# Patient Record
Sex: Female | Born: 1970 | Race: Black or African American | Hispanic: No | Marital: Married
Health system: Midwestern US, Community
[De-identification: ages and names within clinical notes are randomized; demographics above are authoritative.]

## PROBLEM LIST (undated history)

## (undated) ENCOUNTER — Inpatient Hospital Stay (HOSPITAL_COMMUNITY): Payer: Self-pay

## (undated) DIAGNOSIS — Z789 Other specified health status: Secondary | ICD-10-CM

## (undated) DIAGNOSIS — M2391 Unspecified internal derangement of right knee: Secondary | ICD-10-CM

## (undated) DIAGNOSIS — Q396 Congenital diverticulum of esophagus: Secondary | ICD-10-CM

---

## 1992-09-17 HISTORY — PX: SKIN GRAFT: SHX250

## 1998-09-17 HISTORY — PX: BREAST REDUCTION SURGERY: SHX8

## 2016-03-30 LAB — OB RESULTS CONSOLE ABO/RH: RH TYPE: POSITIVE

## 2016-03-30 LAB — OB RESULTS CONSOLE HEPATITIS B SURFACE ANTIGEN: HEP B S AG: NEGATIVE

## 2016-03-30 LAB — OB RESULTS CONSOLE RPR: RPR: NONREACTIVE

## 2016-03-30 LAB — OB RESULTS CONSOLE ANTIBODY SCREEN: ANTIBODY SCREEN: NEGATIVE

## 2016-03-30 LAB — OB RESULTS CONSOLE RUBELLA ANTIBODY, IGM: RUBELLA: IMMUNE

## 2016-03-30 LAB — OB RESULTS CONSOLE HIV ANTIBODY (ROUTINE TESTING): HIV: NONREACTIVE

## 2016-04-03 LAB — OB RESULTS CONSOLE GC/CHLAMYDIA
Chlamydia: NEGATIVE
Gonorrhea: NEGATIVE

## 2016-05-10 LAB — OB RESULTS CONSOLE GBS: GBS: POSITIVE

## 2016-09-03 ENCOUNTER — Ambulatory Visit (HOSPITAL_COMMUNITY): Payer: 59

## 2016-09-03 ENCOUNTER — Other Ambulatory Visit (HOSPITAL_COMMUNITY): Payer: Self-pay | Admitting: Obstetrics and Gynecology

## 2016-09-03 DIAGNOSIS — R609 Edema, unspecified: Secondary | ICD-10-CM

## 2016-09-25 ENCOUNTER — Encounter (HOSPITAL_COMMUNITY): Payer: Self-pay | Admitting: *Deleted

## 2016-09-25 ENCOUNTER — Inpatient Hospital Stay (HOSPITAL_COMMUNITY)
Admission: AD | Admit: 2016-09-25 | Discharge: 2016-09-25 | Disposition: A | Discharge status: Home / Self Care | Payer: BC Managed Care – PPO | Source: Ambulatory Visit | Attending: Obstetrics and Gynecology | Admitting: Obstetrics and Gynecology

## 2016-09-25 DIAGNOSIS — Z3A36 36 weeks gestation of pregnancy: Secondary | ICD-10-CM | POA: Insufficient documentation

## 2016-09-25 DIAGNOSIS — O26893 Other specified pregnancy related conditions, third trimester: Secondary | ICD-10-CM | POA: Insufficient documentation

## 2016-09-25 DIAGNOSIS — M7989 Other specified soft tissue disorders: Secondary | ICD-10-CM | POA: Insufficient documentation

## 2016-09-25 DIAGNOSIS — H538 Other visual disturbances: Secondary | ICD-10-CM | POA: Insufficient documentation

## 2016-09-25 DIAGNOSIS — O133 Gestational [pregnancy-induced] hypertension without significant proteinuria, third trimester: Secondary | ICD-10-CM | POA: Diagnosis not present

## 2016-09-25 LAB — COMPREHENSIVE METABOLIC PANEL
ALK PHOS: 153 U/L — AB (ref 38–126)
ALT: 17 U/L (ref 14–54)
AST: 30 U/L (ref 15–41)
Albumin: 2.8 g/dL — ABNORMAL LOW (ref 3.5–5.0)
Anion gap: 8 (ref 5–15)
BUN: 10 mg/dL (ref 6–20)
CALCIUM: 9.6 mg/dL (ref 8.9–10.3)
CO2: 20 mmol/L — AB (ref 22–32)
CREATININE: 0.85 mg/dL (ref 0.44–1.00)
Chloride: 109 mmol/L (ref 101–111)
GFR calc non Af Amer: >60 mL/min (ref >60)
GLUCOSE: 86 mg/dL (ref 65–99)
Potassium: 3.9 mmol/L (ref 3.5–5.1)
SODIUM: 137 mmol/L (ref 135–145)
Total Bilirubin: 1.1 mg/dL (ref 0.3–1.2)
Total Protein: 6.4 g/dL — ABNORMAL LOW (ref 6.5–8.1)

## 2016-09-25 LAB — CBC
HCT: 40.4 % (ref 36.0–46.0)
HEMOGLOBIN: 14.9 g/dL (ref 12.0–15.0)
MCH: 32 pg (ref 26.0–34.0)
MCHC: 36.9 g/dL — ABNORMAL HIGH (ref 30.0–36.0)
MCV: 86.9 fL (ref 78.0–100.0)
Platelets: 127 10*3/uL — ABNORMAL LOW (ref 150–400)
RBC: 4.65 MIL/uL (ref 3.87–5.11)
RDW: 14.8 % (ref 11.5–15.5)
WBC: 8.4 10*3/uL (ref 4.0–10.5)

## 2016-09-25 LAB — URIC ACID: Uric Acid, Serum: 6.8 mg/dL — ABNORMAL HIGH (ref 2.3–6.6)

## 2016-09-25 LAB — PROTEIN / CREATININE RATIO, URINE
CREATININE, URINE: 28 mg/dL
Total Protein, Urine: <6 mg/dL

## 2016-09-25 NOTE — MAU Note (Signed)
Provider at bedside

## 2016-09-25 NOTE — MAU Note (Signed)
Notified provider that patient's labs were (Provider reviewed labs). Provider said to discharge patient.

## 2016-09-25 NOTE — MAU Note (Signed)
PT [facility] PT has blurred vision with a slight headache. Denies any vaginal bleeding or LOF

## 2016-09-25 NOTE — Discharge Instructions (Signed)
Hypertension During Pregnancy Hypertension is also called high blood pressure. High blood pressure means that the force of your blood moving in your body is too strong. When you are pregnant, this condition should be watched carefully. It can cause problems for you and your baby. Follow these instructions at home: Eating and drinking  Drink enough fluid to keep your pee (urine) clear or pale yellow.  Eat healthy foods that are low in salt (sodium). ? Do not add salt to your food. ? Check labels on foods and drinks to see much salt is in them. Look on the label where you see "Sodium." Lifestyle  Do not use any products that contain nicotine or tobacco, such as cigarettes and e-cigarettes. If you need help quitting, ask your doctor.  Do not use alcohol.  Avoid caffeine.  Avoid stress. Rest and get plenty of sleep. General instructions  Take over-the-counter and prescription medicines only as told by your doctor.  While lying down, lie on your left side. This keeps pressure off your baby.  While sitting or lying down, raise (elevate) your feet. Try putting some pillows under your lower legs.  Exercise regularly. Ask your doctor what kinds of exercise are best for you.  Keep all prenatal and follow-up visits as told by your doctor. This is important. Contact a doctor if:  You have symptoms that your doctor told you to watch for, such as: ? Fever. ? Throwing up (vomiting). ? Headache. Get help right away if:  You have very bad pain in your belly (abdomen).  You are throwing up, and this does not get better with treatment.  You suddenly get swelling in your hands, ankles, or face.  You gain 4 lb (1.8 kg) or more in 1 week.  You get bleeding from your vagina.  You have blood in your pee.  You do not feel your baby moving as much as normal.  You have a change in vision.  You have muscle twitching or sudden tightening (spasms).  You have trouble breathing.  Your lips  or fingernails turn blue. This information is not intended to replace advice given to you by your health care provider. Make sure you discuss any questions you have with your health care provider. Document Released: 10/06/2010 Document Revised: 05/15/2016 Document Reviewed: 05/15/2016 Elsevier Interactive Patient Education  2017 [employer]  

## 2016-09-25 NOTE — MAU Provider Note (Signed)
History     Chief Complaint  Patient presents with  . PIH  46 yo G1P0 MBF @ 36 3/[redacted] weeks gestation sent from office  [facility] to BP 132/90 with c/o h/a, blurry vision and increased leg swelling  OB History    Gravida Para Term Preterm AB Living   1             SAB TAB Ectopic Multiple Live Births                  History reviewed. No pertinent past medical history.  History reviewed. No pertinent surgical history.  History reviewed. No pertinent family history.  Social History  Substance Use Topics  . Smoking status: Not on file  . Smokeless tobacco: Not on file  . Alcohol use Not on file    Allergies: Allergies not on file  No prescriptions prior to admission.     Physical Exam   Blood pressure 123/93, pulse 106, temperature 97.8 F (36.6 C), resp. rate 18, height 5\' 4"  (1.626 m), weight 82.6 kg (182 lb), last menstrual period 01/15/2016.  No exam performed today, sent from office ( exam done there).  ED Course  IMP: gestational HTN P) PIH labs. Serial BP. monitor MDM   addendum:  NST reactive CBC    Component Value Date/Time   WBC 8.4 09/25/2016 1825   RBC 4.65 09/25/2016 1825   HGB 14.9 09/25/2016 1825   HCT 40.4 09/25/2016 1825   PLT 127 (L) 09/25/2016 1825   MCV 86.9 09/25/2016 1825   MCH 32.0 09/25/2016 1825   MCHC 36.9 (H) 09/25/2016 1825   RDW 14.8 09/25/2016 1825   CMP     Component Value Date/Time   NA 137 09/25/2016 1825   K 3.9 09/25/2016 1825   CL 109 09/25/2016 1825   CO2 20 (L) 09/25/2016 1825   GLUCOSE 86 09/25/2016 1825   BUN 10 09/25/2016 1825   CREATININE 0.85 09/25/2016 1825   CALCIUM 9.6 09/25/2016 1825   PROT 6.4 (L) 09/25/2016 1825   ALBUMIN 2.8 (L) 09/25/2016 1825   AST 30 09/25/2016 1825   ALT 17 09/25/2016 1825   ALKPHOS 153 (H) 09/25/2016 1825   BILITOT 1.1 09/25/2016 1825   GFRNONAA >60 09/25/2016 1825   GFRAA >60 09/25/2016 1825  uric acid 6.8 Protein/creatinine ratio  Not measurable P)  d/c home. Return to office on Tuesday. PIH warning signs. oow until next visit  Alfa Leibensperger A, MD 6:57 PM 09/25/2016

## 2016-10-02 ENCOUNTER — Other Ambulatory Visit: Payer: Self-pay | Admitting: Obstetrics and Gynecology

## 2016-10-10 ENCOUNTER — Inpatient Hospital Stay (HOSPITAL_COMMUNITY): Payer: BC Managed Care – PPO

## 2016-10-10 ENCOUNTER — Encounter (HOSPITAL_COMMUNITY): Payer: Self-pay

## 2016-10-10 ENCOUNTER — Inpatient Hospital Stay (HOSPITAL_COMMUNITY)
Admission: AD | Admit: 2016-10-10 | Discharge: 2016-10-15 | DRG: 766 | Disposition: A | Discharge status: Home / Self Care | Payer: BC Managed Care – PPO | Source: Ambulatory Visit | Attending: Obstetrics and Gynecology | Admitting: Obstetrics and Gynecology

## 2016-10-10 DIAGNOSIS — Z6832 Body mass index (BMI) 32.0-32.9, adult: Secondary | ICD-10-CM | POA: Diagnosis not present

## 2016-10-10 DIAGNOSIS — Z3A38 38 weeks gestation of pregnancy: Secondary | ICD-10-CM | POA: Diagnosis not present

## 2016-10-10 DIAGNOSIS — O99824 Streptococcus B carrier state complicating childbirth: Secondary | ICD-10-CM | POA: Diagnosis present

## 2016-10-10 DIAGNOSIS — O99214 Obesity complicating childbirth: Secondary | ICD-10-CM | POA: Diagnosis present

## 2016-10-10 DIAGNOSIS — R030 Elevated blood-pressure reading, without diagnosis of hypertension: Secondary | ICD-10-CM | POA: Diagnosis present

## 2016-10-10 DIAGNOSIS — O1494 Unspecified pre-eclampsia, complicating childbirth: Principal | ICD-10-CM | POA: Diagnosis present

## 2016-10-10 DIAGNOSIS — E669 Obesity, unspecified: Secondary | ICD-10-CM | POA: Diagnosis present

## 2016-10-10 DIAGNOSIS — O1493 Unspecified pre-eclampsia, third trimester: Secondary | ICD-10-CM | POA: Diagnosis present

## 2016-10-10 HISTORY — DX: Other specified health status: Z78.9

## 2016-10-10 LAB — URINALYSIS, ROUTINE W REFLEX MICROSCOPIC
Bilirubin Urine: NEGATIVE
Glucose, UA: NEGATIVE mg/dL
Hgb urine dipstick: NEGATIVE
KETONES UR: NEGATIVE mg/dL
Leukocytes, UA: NEGATIVE
NITRITE: NEGATIVE
PH: 6 (ref 5.0–8.0)
PROTEIN: NEGATIVE mg/dL
Specific Gravity, Urine: 1.004 — ABNORMAL LOW (ref 1.005–1.030)

## 2016-10-10 LAB — CBC
HCT: 40.5 % (ref 36.0–46.0)
Hemoglobin: 14.8 g/dL (ref 12.0–15.0)
MCH: 32 pg (ref 26.0–34.0)
MCHC: 36.5 g/dL — AB (ref 30.0–36.0)
MCV: 87.5 fL (ref 78.0–100.0)
PLATELETS: 137 10*3/uL — AB (ref 150–400)
RBC: 4.63 MIL/uL (ref 3.87–5.11)
RDW: 15 % (ref 11.5–15.5)
WBC: 8.5 10*3/uL (ref 4.0–10.5)

## 2016-10-10 LAB — COMPREHENSIVE METABOLIC PANEL
ALT: 16 U/L (ref 14–54)
ANION GAP: 10 (ref 5–15)
AST: 33 U/L (ref 15–41)
Albumin: 2.9 g/dL — ABNORMAL LOW (ref 3.5–5.0)
Alkaline Phosphatase: 214 U/L — ABNORMAL HIGH (ref 38–126)
BUN: 13 mg/dL (ref 6–20)
CO2: 21 mmol/L — AB (ref 22–32)
Calcium: 9.3 mg/dL (ref 8.9–10.3)
Chloride: 107 mmol/L (ref 101–111)
Creatinine, Ser: 1.03 mg/dL — ABNORMAL HIGH (ref 0.44–1.00)
GFR calc non Af Amer: >60 mL/min (ref >60)
GLUCOSE: 105 mg/dL — AB (ref 65–99)
POTASSIUM: 3.9 mmol/L (ref 3.5–5.1)
SODIUM: 138 mmol/L (ref 135–145)
TOTAL PROTEIN: 6.6 g/dL (ref 6.5–8.1)
Total Bilirubin: 0.8 mg/dL (ref 0.3–1.2)

## 2016-10-10 LAB — PROTEIN / CREATININE RATIO, URINE
CREATININE, URINE: 44 mg/dL
PROTEIN CREATININE RATIO: 0.5 mg/mg{creat} — AB (ref 0.00–0.15)
TOTAL PROTEIN, URINE: 22 mg/dL

## 2016-10-10 LAB — URIC ACID: Uric Acid, Serum: 7.3 mg/dL — ABNORMAL HIGH (ref 2.3–6.6)

## 2016-10-10 MED ORDER — OXYTOCIN 40 UNITS IN LACTATED RINGERS INFUSION - SIMPLE MED: 2.5000 [IU]/h | INTRAVENOUS | Status: DC

## 2016-10-10 MED ORDER — LACTATED RINGERS IV SOLN
INTRAVENOUS | Status: DC
  Administered 2016-10-10 – 2016-10-11 (×2): via INTRAVENOUS
  Administered 2016-10-11: 125 mL via INTRAVENOUS
  Administered 2016-10-11 – 2016-10-12 (×2): via INTRAVENOUS

## 2016-10-10 MED ORDER — MISOPROSTOL 25 MCG QUARTER TABLET
25.0000 ug | ORAL_TABLET | ORAL | Status: DC | PRN
  Administered 2016-10-10: 25 ug via VAGINAL
  Filled 2016-10-10: qty 0.25

## 2016-10-10 MED ORDER — LACTATED RINGERS IV SOLN: 500.0000 mL | INTRAVENOUS | Status: DC | PRN

## 2016-10-10 MED ORDER — OXYCODONE-ACETAMINOPHEN 5-325 MG PO TABS: 2.0000 | ORAL_TABLET | ORAL | Status: DC | PRN

## 2016-10-10 MED ORDER — OXYTOCIN BOLUS FROM INFUSION: 500.0000 mL | Freq: Once | INTRAVENOUS | Status: DC

## 2016-10-10 MED ORDER — OXYTOCIN 10 UNIT/ML IJ SOLN: 10.0000 [IU] | Freq: Once | INTRAMUSCULAR | Status: DC

## 2016-10-10 MED ORDER — PENICILLIN G POT IN DEXTROSE 60000 UNIT/ML IV SOLN
3.0000 10*6.[IU] | INTRAVENOUS | Status: DC
  Administered 2016-10-11 – 2016-10-12 (×8): 3 10*6.[IU] via INTRAVENOUS
  Filled 2016-10-10 (×10): qty 50

## 2016-10-10 MED ORDER — OXYCODONE-ACETAMINOPHEN 5-325 MG PO TABS: 1.0000 | ORAL_TABLET | ORAL | Status: DC | PRN

## 2016-10-10 MED ORDER — PENICILLIN G POTASSIUM 5000000 UNITS IJ SOLR
5.0000 10*6.[IU] | Freq: Once | INTRAVENOUS | Status: AC
  Administered 2016-10-10: 5 10*6.[IU] via INTRAVENOUS
  Filled 2016-10-10: qty 5

## 2016-10-10 MED ORDER — LIDOCAINE HCL (PF) 1 % IJ SOLN: 30.0000 mL | INTRAMUSCULAR | Status: DC | PRN

## 2016-10-10 MED ORDER — TERBUTALINE SULFATE 1 MG/ML IJ SOLN
0.2500 mg | Freq: Once | INTRAMUSCULAR | Status: DC | PRN
Start: 2016-10-10 — End: 2016-10-12

## 2016-10-10 MED ORDER — ONDANSETRON HCL 4 MG/2ML IJ SOLN: 4.0000 mg | Freq: Four times a day (QID) | INTRAMUSCULAR | Status: DC | PRN

## 2016-10-10 MED ORDER — ACETAMINOPHEN 325 MG PO TABS: 650.0000 mg | ORAL_TABLET | ORAL | Status: DC | PRN

## 2016-10-10 MED ORDER — SOD CITRATE-CITRIC ACID 500-334 MG/5ML PO SOLN
30.0000 mL | ORAL | Status: DC | PRN
  Administered 2016-10-12: 30 mL via ORAL
  Filled 2016-10-10: qty 15

## 2016-10-10 MED ORDER — BUTORPHANOL TARTRATE 1 MG/ML IJ SOLN
2.0000 mg | INTRAMUSCULAR | Status: DC | PRN
  Administered 2016-10-11 (×3): 2 mg via INTRAVENOUS
  Filled 2016-10-10 (×3): qty 2

## 2016-10-10 NOTE — MAU Provider Note (Signed)
History     Chief Complaint  Patient presents with  . Hypertension  . Headache   46 yo G1P0 MBF @ 38 3/7 weeks sent from office for further evaluation due to NR NST and elevated BP. Pt denies visual changes or epigastric pain. (+) leg swelling  OB History    Gravida Para Term Preterm AB Living   1             SAB TAB Ectopic Multiple Live Births                  History reviewed. No pertinent past medical history.  History reviewed. No pertinent surgical history.  History reviewed. No pertinent family history.  Social History  Substance Use Topics  . Smoking status: Not on file  . Smokeless tobacco: Not on file  . Alcohol use Not on file    Allergies: No Known Allergies  Prescriptions Prior to Admission  Medication Sig Dispense Refill Last Dose  . cephALEXin (KEFLEX) 500 MG capsule Take 500 mg by mouth at bedtime.   10/09/2016 at Unknown time  . Prenatal Vit-Fe Fumarate-FA (PRENATAL MULTIVITAMIN) TABS tablet Take 1 tablet by mouth every morning.   10/10/2016 at Unknown time     Physical Exam   Blood pressure 133/90, pulse 92, last menstrual period 01/15/2016.  General appearance: alert, cooperative and no distress Lungs: clear to auscultation bilaterally Heart: regular rate and rhythm, S1, S2 normal, no murmur, click, rub or gallop Abdomen: gravid Pelvic: cervix normal in appearance and l/c/ant Extremities: edema 1+  Sono: BPP 8/8, nl fluid  Tracing > baseline 125 min variability  IMP: HTN r/o preeclampsia IUP @ 38 3/7 weeks P) PIH labs, protein/creatinine ratio If labs nl, will admit for IOL ED Course   MDM  Labs done: protein/creatinine ratio 0.5, stable low plt. elev uric acid. Will bring in for IOL Monterius Rolf A, MD 6:00 PM 10/10/2016

## 2016-10-10 NOTE — MAU Note (Signed)
Sent from office with elevated BP, 2nd time she has been sent for this.  Reports slight headache, and increase in swelling, denies epigastric pain or visual changes.

## 2016-10-10 NOTE — H&P (Signed)
Melinda Lozano is a 46 y.o. female presenting with elevated BP and leg swelling with labs c/w preeclampsia. (+) FM. PNC complicated by (+) GBS. This is donor egg IVF/FET preg. Pt notes mild h/a and recurrence of leg swelling. Denies blurry vision or epigastric pain OB History    Gravida Para Term Preterm AB Living   1             SAB TAB Ectopic Multiple Live Births                 Past Medical History:  Diagnosis Date  . Medical history non-contributory    Past Surgical History:  Procedure Laterality Date  . BREAST REDUCTION SURGERY Bilateral 2000  . SKIN GRAFT Right 1994   skin graft taken off of right hip and put on right index finger   Family History: family history is not on file. Social History:  reports that she has never smoked. She has never used smokeless tobacco. She reports that she does not drink alcohol or use drugs.     Maternal Diabetes: No Genetic Screening: Normal Maternal Ultrasounds/Referrals: Normal Fetal Ultrasounds or other Referrals:  Other:  Maternal Substance Abuse:  No Significant Maternal Medications:  None Significant Maternal Lab Results:  Lab values include: Group B Strep positive Other Comments:  donor egg/ FET  Review of Systems  Eyes: Negative for blurred vision.  Cardiovascular: Positive for leg swelling.  Gastrointestinal: Negative for heartburn.  Neurological: Positive for headaches.  All other systems reviewed and are negative.  History   Blood pressure (!) 142/90, pulse 91, temperature 98.6 F (37 C), temperature source Oral, resp. rate 16, height 5\' 3"  (1.6 m), weight 82.1 kg (181 lb), last menstrual period 01/15/2016. Exam Physical Exam  Constitutional: She is oriented to person, place, and time. She appears well-developed and well-nourished.  HENT:  Head: Atraumatic.  Eyes: EOM are normal.  Neck: Neck supple.  Respiratory: Effort normal.  GI: Soft.  Musculoskeletal: She exhibits edema.  Neurological: She is alert and  oriented to person, place, and time. She has normal reflexes.  Skin: Skin is warm and dry.  Psychiatric: She has a normal mood and affect.   VE long/thick/ant/OOP vtx   Prenatal labs: ABO, Rh:  A positive Antibody:  neg Rubella: Immune (07/14 0000) RPR: Nonreactive (07/14 0000)  HBsAg:   neg HIV: Non-reactive (07/14 0000)  GBS:   positive CBC    Component Value Date/Time   WBC 8.5 10/10/2016 1717   RBC 4.63 10/10/2016 1717   HGB 14.8 10/10/2016 1717   HCT 40.5 10/10/2016 1717   PLT 137 (L) 10/10/2016 1717   MCV 87.5 10/10/2016 1717   MCH 32.0 10/10/2016 1717   MCHC 36.5 (H) 10/10/2016 1717   RDW 15.0 10/10/2016 1717  urine prot/creatinine ratio 0.5 CMP Latest Ref Rng & Units 10/10/2016 09/25/2016  Glucose 65 - 99 mg/dL 098(J) 86  BUN 6 - 20 mg/dL 13 10  Creatinine 1.91 - 1.00 mg/dL 4.78(G) 9.56  Sodium 213 - 145 mmol/L 138 137  Potassium 3.5 - 5.1 mmol/L 3.9 3.9  Chloride 101 - 111 mmol/L 107 109  CO2 22 - 32 mmol/L 21(L) 20(L)  Calcium 8.9 - 10.3 mg/dL 9.3 9.6  Total Protein 6.5 - 8.1 g/dL 6.6 0.8(M)  Total Bilirubin 0.3 - 1.2 mg/dL 0.8 1.1  Alkaline Phos 38 - 126 U/L 214(H) 153(H)  AST 15 - 41 U/L 33 30  ALT 14 - 54 U/L 16 17  uric acid  7.3   Assessment/Plan: Preeclampsia  GBS cx (+) IUP @ 38 3/7 weeks P) admit routine labs. Cervical ripening. Start IV PCN. Magnesium sulfate in active labor. Analgesic prn. Labetalol/hydralazine prn  Melinda Lozano A 10/10/2016, 7:58 PM

## 2016-10-11 ENCOUNTER — Inpatient Hospital Stay (HOSPITAL_COMMUNITY): Payer: BC Managed Care – PPO | Admitting: Anesthesiology

## 2016-10-11 LAB — CBC
HCT: 40.3 % (ref 36.0–46.0)
HEMOGLOBIN: 14.1 g/dL (ref 12.0–15.0)
MCH: 30.5 pg (ref 26.0–34.0)
MCHC: 35 g/dL (ref 30.0–36.0)
MCV: 87.2 fL (ref 78.0–100.0)
Platelets: 130 10*3/uL — ABNORMAL LOW (ref 150–400)
RBC: 4.62 MIL/uL (ref 3.87–5.11)
RDW: 15.3 % (ref 11.5–15.5)
WBC: 11.7 10*3/uL — AB (ref 4.0–10.5)

## 2016-10-11 LAB — COMPREHENSIVE METABOLIC PANEL
ALK PHOS: 204 U/L — AB (ref 38–126)
ALT: 16 U/L (ref 14–54)
ANION GAP: 9 (ref 5–15)
AST: 33 U/L (ref 15–41)
Albumin: 2.8 g/dL — ABNORMAL LOW (ref 3.5–5.0)
BILIRUBIN TOTAL: 0.9 mg/dL (ref 0.3–1.2)
BUN: 9 mg/dL (ref 6–20)
CALCIUM: 9.1 mg/dL (ref 8.9–10.3)
CO2: 18 mmol/L — ABNORMAL LOW (ref 22–32)
Chloride: 113 mmol/L — ABNORMAL HIGH (ref 101–111)
Creatinine, Ser: 0.96 mg/dL (ref 0.44–1.00)
GFR calc Af Amer: >60 mL/min (ref >60)
Glucose, Bld: 103 mg/dL — ABNORMAL HIGH (ref 65–99)
POTASSIUM: 4.2 mmol/L (ref 3.5–5.1)
Sodium: 140 mmol/L (ref 135–145)
TOTAL PROTEIN: 6.4 g/dL — AB (ref 6.5–8.1)

## 2016-10-11 LAB — PROTEIN / CREATININE RATIO, URINE
CREATININE, URINE: 25 mg/dL
Protein Creatinine Ratio: 0.56 mg/mg{Cre} — ABNORMAL HIGH (ref 0.00–0.15)
TOTAL PROTEIN, URINE: 14 mg/dL

## 2016-10-11 LAB — URIC ACID: URIC ACID, SERUM: 6.9 mg/dL — AB (ref 2.3–6.6)

## 2016-10-11 LAB — LACTATE DEHYDROGENASE: LDH: 239 U/L — ABNORMAL HIGH (ref 98–192)

## 2016-10-11 LAB — MAGNESIUM: Magnesium: 3.9 mg/dL — ABNORMAL HIGH (ref 1.7–2.4)

## 2016-10-11 MED ORDER — EPHEDRINE 5 MG/ML INJ: 10.0000 mg | INTRAVENOUS | Status: DC | PRN

## 2016-10-11 MED ORDER — OXYTOCIN 40 UNITS IN LACTATED RINGERS INFUSION - SIMPLE MED
1.0000 m[IU]/min | INTRAVENOUS | Status: DC
  Administered 2016-10-11: 2 m[IU]/min via INTRAVENOUS
  Administered 2016-10-12: 28 m[IU]/min via INTRAVENOUS
  Filled 2016-10-11 (×2): qty 1000

## 2016-10-11 MED ORDER — PHENYLEPHRINE 40 MCG/ML (10ML) SYRINGE FOR IV PUSH (FOR BLOOD PRESSURE SUPPORT): 80.0000 ug | PREFILLED_SYRINGE | INTRAVENOUS | Status: DC | PRN

## 2016-10-11 MED ORDER — MAGNESIUM SULFATE 50 % IJ SOLN
2.0000 g/h | INTRAVENOUS | Status: DC
  Administered 2016-10-11: 2 g/h via INTRAVENOUS
  Filled 2016-10-11: qty 80

## 2016-10-11 MED ORDER — LACTATED RINGERS IV SOLN: 500.0000 mL | Freq: Once | INTRAVENOUS | Status: DC

## 2016-10-11 MED ORDER — MAGNESIUM SULFATE BOLUS VIA INFUSION
4.0000 g | Freq: Once | INTRAVENOUS | Status: AC
  Administered 2016-10-11: 4 g via INTRAVENOUS
  Filled 2016-10-11: qty 500

## 2016-10-11 MED ORDER — DIPHENHYDRAMINE HCL 50 MG/ML IJ SOLN: 12.5000 mg | INTRAMUSCULAR | Status: DC | PRN

## 2016-10-11 MED ORDER — LABETALOL HCL 5 MG/ML IV SOLN
20.0000 mg | Freq: Once | INTRAVENOUS | Status: AC
  Administered 2016-10-11: 20 mg via INTRAVENOUS
  Filled 2016-10-11: qty 4

## 2016-10-11 MED ORDER — FENTANYL 2.5 MCG/ML BUPIVACAINE 1/10 % EPIDURAL INFUSION (WH - ANES)
14.0000 mL/h | INTRAMUSCULAR | Status: DC | PRN
  Administered 2016-10-11 – 2016-10-12 (×2): 14 mL/h via EPIDURAL
  Filled 2016-10-11 (×2): qty 100

## 2016-10-11 MED ORDER — PHENYLEPHRINE 40 MCG/ML (10ML) SYRINGE FOR IV PUSH (FOR BLOOD PRESSURE SUPPORT)
80.0000 ug | PREFILLED_SYRINGE | INTRAVENOUS | Status: DC | PRN
  Filled 2016-10-11: qty 10

## 2016-10-11 MED ORDER — LACTATED RINGERS IV SOLN
500.0000 mL | Freq: Once | INTRAVENOUS | Status: AC
  Administered 2016-10-11: 500 mL via INTRAVENOUS

## 2016-10-11 MED ORDER — TERBUTALINE SULFATE 1 MG/ML IJ SOLN: 0.2500 mg | Freq: Once | INTRAMUSCULAR | Status: DC | PRN

## 2016-10-11 MED ORDER — LABETALOL HCL 100 MG PO TABS
100.0000 mg | ORAL_TABLET | Freq: Two times a day (BID) | ORAL | Status: DC
  Administered 2016-10-12 – 2016-10-15 (×7): 100 mg via ORAL
  Filled 2016-10-11 (×6): qty 1

## 2016-10-11 MED ORDER — LIDOCAINE HCL (PF) 1 % IJ SOLN
INTRAMUSCULAR | Status: DC | PRN
  Administered 2016-10-11: 2 mL via EPIDURAL
  Administered 2016-10-11: 3 mL via EPIDURAL
  Administered 2016-10-11: 5 mL via EPIDURAL

## 2016-10-11 NOTE — Progress Notes (Signed)
S; no complaint Breathing with ctx  O; BP 138/84   Pulse 97   Temp 98 F (36.7 C) (Oral)   Resp 17   Ht 5\' 3"  (1.6 m)   Wt 82.1 kg (181 lb)   LMP 01/15/2016   BMI 32.06 kg/m   VE balloon adjusted Bloody show 1/90/oop  CBC    Component Value Date/Time   WBC 8.5 10/10/2016 1717   RBC 4.63 10/10/2016 1717   HGB 14.8 10/10/2016 1717   HCT 40.5 10/10/2016 1717   PLT 137 (L) 10/10/2016 1717   MCV 87.5 10/10/2016 1717   MCH 32.0 10/10/2016 1717   MCHC 36.5 (H) 10/10/2016 1717   RDW 15.0 10/10/2016 1717    IMP: preeclampsia Term gestation GBS cx (+) on IV PCN P) cont with pitocin. Monitor BP

## 2016-10-11 NOTE — Progress Notes (Signed)
Initial visit with Melinda Lozano and her husband to offer support and education related to advance directives.  Pt does not desire to complete advance directives at this time.  Left educational materials and information on how to contact spiritual care if she desires to complete them during her hospitalization.  Please page as further needs arise.  Maryanna ShapeAmanda M. Carley Hammedavee Lomax, M.Div. [facility] Chaplain Pager [phone] Office [phone]

## 2016-10-11 NOTE — Progress Notes (Signed)
Melinda Lozano is a 46 y.o. G1P0 at 7172w4d by ultrasound admitted for induction of labor due to Pre-eclamptic toxemia of pregnancy..  Subjective: Chief Complaint  Patient presents with  . Hypertension  . Headache   No complaint. S/p stadol IV x 3 Objective: BP (!) 132/99   Pulse 89   Temp 97.5 F (36.4 C) (Oral)   Resp 16   Ht 5\' 3"  (1.6 m)   Wt 82.1 kg (181 lb)   LMP 01/15/2016   BMI 32.06 kg/m  No intake/output data recorded. No intake/output data recorded. Pitocin FHT:  FHR: 125 bpm, variability: moderate,  accelerations:  Present,  decelerations:  Absent UC:  4-445mins SVE:   4 cm dilated, thickeffaced, -3 station Tracing:cat 1 Intracervical balloon out Labs: Lab Results  Component Value Date   WBC 11.7 (H) 10/11/2016   HGB 14.1 10/11/2016   HCT 40.3 10/11/2016   MCV 87.2 10/11/2016   PLT 130 (L) 10/11/2016    Assessment / Plan: Induction of labor due to preeclampsia,  progressing well on pitocin  GBS cx (+) on IV PCN IUP @ 38 4/7 weeks P) epidural. Start magnesium sulfate. Amniotomy. IUPC/ISE . Start labetalol 100 mg po bid. Cont pitocin   Anticipated MOD:  unknown  Melinda Lozano A 10/11/2016, 7:09 PM

## 2016-10-11 NOTE — Anesthesia Preprocedure Evaluation (Signed)
Anesthesia Evaluation  Patient identified by MRN, date of birth, ID band Patient awake    Reviewed: Allergy & Precautions, NPO status , Patient's Chart, lab work & pertinent test results, reviewed documented beta blocker date and time   Airway Mallampati: III  TM Distance: >3 FB Neck ROM: Full    Dental  (+) Teeth Intact, Dental Advisory Given   Pulmonary neg pulmonary ROS,    Pulmonary exam normal breath sounds clear to auscultation       Cardiovascular hypertension, Normal cardiovascular exam Rhythm:Regular Rate:Normal     Neuro/Psych negative neurological ROS  negative psych ROS   GI/Hepatic negative GI ROS, Neg liver ROS,   Endo/Other  Obesity   Renal/GU negative Renal ROS     Musculoskeletal negative musculoskeletal ROS (+)   Abdominal   Peds  Hematology  (+) Blood dyscrasia (Plt 130k), ,   Anesthesia Other Findings Day of surgery medications reviewed with the patient.  Reproductive/Obstetrics (+) Pregnancy Pre-eclampsia--c/o headaches, denies blurred vision                             Anesthesia Physical Anesthesia Plan  ASA: III  Anesthesia Plan: Epidural   Post-op Pain Management:    Induction:   Airway Management Planned:   Additional Equipment:   Intra-op Plan:   Post-operative Plan:   Informed Consent: I have reviewed the patients History and Physical, chart, labs and discussed the procedure including the risks, benefits and alternatives for the proposed anesthesia with the patient or authorized representative who has indicated his/her understanding and acceptance.   Dental advisory given  Plan Discussed with:   Anesthesia Plan Comments: (Patient identified. Risks/Benefits/Options discussed with patient including but not limited to bleeding, infection, nerve damage, paralysis, failed block, incomplete pain control, headache, blood pressure changes, nausea,  vomiting, reactions to medication both or allergic, itching and postpartum back pain. Confirmed with bedside nurse the patient's most recent platelet count. Confirmed with patient that they are not currently taking any anticoagulation, have any bleeding history or any family history of bleeding disorders. Patient expressed understanding and wished to proceed. All questions were answered. )        Anesthesia Quick Evaluation

## 2016-10-11 NOTE — Anesthesia Pain Management Evaluation Note (Signed)
  CRNA Pain Management Visit Note  Patient: Melinda Lozano, 46 y.o., female  "[facility]. We have an anesthesia team available at all times to provide care throughout the hospital, including epidural management and anesthesia for C-section. I don't know your plan for the delivery whether it a natural birth, water birth, IV sedation, nitrous supplementation, doula or epidural, but we want to meet your pain goals."   1.Was your pain managed to your expectations on prior hospitalizations?   No prior hospitalizations  2.What is your expectation for pain management during this hospitalization?     Epidural  3.How can we help you reach that goal? Epidural when my doctor lets me have one.  Record the patient's initial score and the patient's pain goal.   Pain: 4  Pain Goal: 7 [facility] wants you to be able to say your pain was always managed very well.  Glynna Failla 10/11/2016

## 2016-10-11 NOTE — Progress Notes (Signed)
S: comfortable   O: BP 134/77   Pulse 81   Temp 98.6 F (37 C) (Oral)   Resp 16   Ht 5\' 3"  (1.6 m)   Wt 82.1 kg (181 lb)   LMP 01/15/2016   BMI 32.06 kg/m   Pitocin 6 miu VE 1/50/-3 Intracervical balloon placed  Tracing: reactive  Baseline 140 Ctx q 2-3 mins  A/P: Preeclampsia Term gestation P) cont pitocin. CBC this am analgesic prn

## 2016-10-11 NOTE — Anesthesia Procedure Notes (Signed)
Epidural Patient location during procedure: OB Start time: 10/11/2016 6:30 PM End time: 10/11/2016 6:35 PM  Staffing Anesthesiologist: Cecile HearingURK, Juaquina Machnik EDWARD Performed: anesthesiologist   Preanesthetic Checklist Completed: patient identified, pre-op evaluation, timeout performed, IV checked, risks and benefits discussed and monitors and equipment checked  Epidural Patient position: sitting Prep: DuraPrep Patient monitoring: blood pressure and continuous pulse ox Approach: midline Location: L3-L4 Injection technique: LOR air  Needle:  Needle type: Tuohy  Needle gauge: 17 G Needle length: 9 cm Needle insertion depth: 5 cm Catheter size: 19 Gauge Catheter at skin depth: 10 cm Test dose: negative and Other (1% Lidocaine)  Additional Notes Patient identified.  Risk benefits discussed including failed block, incomplete pain control, headache, nerve damage, paralysis, blood pressure changes, nausea, vomiting, reactions to medication both toxic or allergic, and postpartum back pain.  Patient expressed understanding and wished to proceed.  All questions were answered.  Sterile technique used throughout procedure and epidural site dressed with sterile barrier dressing. No paresthesia or other complications noted. The patient did not experience any signs of intravascular injection such as tinnitus or metallic taste in mouth nor signs of intrathecal spread such as rapid motor block. Please see nursing notes for vital signs. Reason for block:procedure for pain

## 2016-10-12 ENCOUNTER — Encounter (HOSPITAL_COMMUNITY)
Admission: AD | Disposition: A | Discharge status: Home / Self Care | Payer: Self-pay | Source: Ambulatory Visit | Attending: Obstetrics and Gynecology

## 2016-10-12 ENCOUNTER — Encounter (HOSPITAL_COMMUNITY): Payer: Self-pay

## 2016-10-12 LAB — CBC
HCT: 38.5 % (ref 36.0–46.0)
HEMATOCRIT: 40.3 % (ref 36.0–46.0)
Hemoglobin: 13.7 g/dL (ref 12.0–15.0)
Hemoglobin: 14.2 g/dL (ref 12.0–15.0)
MCH: 31.4 pg (ref 26.0–34.0)
MCH: 30.8 pg (ref 26.0–34.0)
MCHC: 35.6 g/dL (ref 30.0–36.0)
MCHC: 35.2 g/dL (ref 30.0–36.0)
MCV: 88.1 fL (ref 78.0–100.0)
MCV: 87.4 fL (ref 78.0–100.0)
PLATELETS: 133 10*3/uL — AB (ref 150–400)
Platelets: 123 10*3/uL — ABNORMAL LOW (ref 150–400)
RBC: 4.37 MIL/uL (ref 3.87–5.11)
RBC: 4.61 MIL/uL (ref 3.87–5.11)
RDW: 15.3 % (ref 11.5–15.5)
RDW: 15.3 % (ref 11.5–15.5)
WBC: 12.1 10*3/uL — AB (ref 4.0–10.5)
WBC: 15 10*3/uL — ABNORMAL HIGH (ref 4.0–10.5)

## 2016-10-12 LAB — MAGNESIUM: Magnesium: 6.2 mg/dL (ref 1.7–2.4)

## 2016-10-12 SURGERY — Surgical Case
Anesthesia: Epidural | Wound class: Clean Contaminated

## 2016-10-12 MED ORDER — NALBUPHINE HCL 10 MG/ML IJ SOLN: 5.0000 mg | INTRAMUSCULAR | Status: DC | PRN

## 2016-10-12 MED ORDER — TRIAMCINOLONE ACETONIDE 40 MG/ML IJ SUSP
40.0000 mg | Freq: Once | INTRAMUSCULAR | Status: AC
  Administered 2016-10-12: 40 mg via INTRAMUSCULAR
  Filled 2016-10-12: qty 1

## 2016-10-12 MED ORDER — NALOXONE HCL 0.4 MG/ML IJ SOLN: 0.4000 mg | INTRAMUSCULAR | Status: DC | PRN

## 2016-10-12 MED ORDER — SODIUM CHLORIDE 0.9 % IR SOLN
Status: DC | PRN
  Administered 2016-10-12: 1

## 2016-10-12 MED ORDER — OXYTOCIN 10 UNIT/ML IJ SOLN
INTRAVENOUS | Status: DC | PRN
  Administered 2016-10-12: 40 [IU] via INTRAVENOUS

## 2016-10-12 MED ORDER — DIPHENHYDRAMINE HCL 25 MG PO CAPS: 25.0000 mg | ORAL_CAPSULE | Freq: Four times a day (QID) | ORAL | Status: DC | PRN

## 2016-10-12 MED ORDER — OXYCODONE-ACETAMINOPHEN 5-325 MG PO TABS
2.0000 | ORAL_TABLET | ORAL | Status: DC | PRN
  Administered 2016-10-15: 2 via ORAL
  Filled 2016-10-12: qty 2

## 2016-10-12 MED ORDER — COCONUT OIL OIL: 1.0000 "application " | TOPICAL_OIL | Status: DC | PRN

## 2016-10-12 MED ORDER — MORPHINE SULFATE (PF) 0.5 MG/ML IJ SOLN
INTRAMUSCULAR | Status: AC
  Filled 2016-10-12: qty 10

## 2016-10-12 MED ORDER — SCOPOLAMINE 1 MG/3DAYS TD PT72
MEDICATED_PATCH | TRANSDERMAL | Status: AC
  Filled 2016-10-12: qty 1

## 2016-10-12 MED ORDER — LACTATED RINGERS IV SOLN
INTRAVENOUS | Status: DC | PRN
  Administered 2016-10-12: 08:00:00 via INTRAVENOUS

## 2016-10-12 MED ORDER — OXYTOCIN 40 UNITS IN LACTATED RINGERS INFUSION - SIMPLE MED: 2.5000 [IU]/h | INTRAVENOUS | Status: AC

## 2016-10-12 MED ORDER — ERYTHROMYCIN 5 MG/GM OP OINT
TOPICAL_OINTMENT | OPHTHALMIC | Status: AC
  Filled 2016-10-12: qty 1

## 2016-10-12 MED ORDER — MORPHINE SULFATE (PF) 0.5 MG/ML IJ SOLN
INTRAMUSCULAR | Status: DC | PRN
  Administered 2016-10-12: 3 mg via EPIDURAL

## 2016-10-12 MED ORDER — OXYCODONE-ACETAMINOPHEN 5-325 MG PO TABS
1.0000 | ORAL_TABLET | ORAL | Status: DC | PRN
  Administered 2016-10-14 – 2016-10-15 (×4): 1 via ORAL
  Filled 2016-10-12 (×5): qty 1

## 2016-10-12 MED ORDER — HYDROMORPHONE HCL 1 MG/ML IJ SOLN
0.2500 mg | INTRAMUSCULAR | Status: DC | PRN
  Administered 2016-10-12 (×2): 0.25 mg via INTRAVENOUS

## 2016-10-12 MED ORDER — NALBUPHINE HCL 10 MG/ML IJ SOLN: 5.0000 mg | Freq: Once | INTRAMUSCULAR | Status: DC | PRN

## 2016-10-12 MED ORDER — ACETAMINOPHEN 500 MG PO TABS: 1000.0000 mg | ORAL_TABLET | Freq: Four times a day (QID) | ORAL | Status: DC

## 2016-10-12 MED ORDER — BUPIVACAINE HCL (PF) 0.25 % IJ SOLN
INTRAMUSCULAR | Status: DC | PRN
  Administered 2016-10-12: 5.5 mL

## 2016-10-12 MED ORDER — MENTHOL 3 MG MT LOZG: 1.0000 | LOZENGE | OROMUCOSAL | Status: DC | PRN

## 2016-10-12 MED ORDER — MEPERIDINE HCL 25 MG/ML IJ SOLN: 6.2500 mg | INTRAMUSCULAR | Status: DC | PRN

## 2016-10-12 MED ORDER — PHENYLEPHRINE HCL 10 MG/ML IJ SOLN
INTRAMUSCULAR | Status: DC | PRN
  Administered 2016-10-12: 120 ug via INTRAVENOUS
  Administered 2016-10-12: 80 ug via INTRAVENOUS
  Administered 2016-10-12: 40 ug via INTRAVENOUS
  Administered 2016-10-12: 80 ug via INTRAVENOUS
  Administered 2016-10-12: 120 ug via INTRAVENOUS
  Administered 2016-10-12 (×3): 80 ug via INTRAVENOUS

## 2016-10-12 MED ORDER — ZOLPIDEM TARTRATE 5 MG PO TABS: 5.0000 mg | ORAL_TABLET | Freq: Every evening | ORAL | Status: DC | PRN

## 2016-10-12 MED ORDER — MAGNESIUM SULFATE 50 % IJ SOLN
2.0000 g/h | INTRAVENOUS | Status: DC
  Administered 2016-10-12: 2 g/h via INTRAVENOUS
  Filled 2016-10-12: qty 80

## 2016-10-12 MED ORDER — SIMETHICONE 80 MG PO CHEW
80.0000 mg | CHEWABLE_TABLET | Freq: Three times a day (TID) | ORAL | Status: DC
  Administered 2016-10-12 – 2016-10-15 (×8): 80 mg via ORAL
  Filled 2016-10-12 (×8): qty 1

## 2016-10-12 MED ORDER — IBUPROFEN 600 MG PO TABS
600.0000 mg | ORAL_TABLET | Freq: Four times a day (QID) | ORAL | Status: DC
  Administered 2016-10-12 – 2016-10-15 (×11): 600 mg via ORAL
  Filled 2016-10-12 (×11): qty 1

## 2016-10-12 MED ORDER — BUPIVACAINE HCL (PF) 0.25 % IJ SOLN
INTRAMUSCULAR | Status: AC
  Filled 2016-10-12: qty 30

## 2016-10-12 MED ORDER — SODIUM CHLORIDE 0.9% FLUSH: 3.0000 mL | INTRAVENOUS | Status: DC | PRN

## 2016-10-12 MED ORDER — SIMETHICONE 80 MG PO CHEW
80.0000 mg | CHEWABLE_TABLET | ORAL | Status: DC
  Administered 2016-10-12 – 2016-10-15 (×3): 80 mg via ORAL
  Filled 2016-10-12 (×3): qty 1

## 2016-10-12 MED ORDER — SCOPOLAMINE 1 MG/3DAYS TD PT72
1.0000 | MEDICATED_PATCH | Freq: Once | TRANSDERMAL | Status: DC
  Administered 2016-10-12: 1.5 mg via TRANSDERMAL

## 2016-10-12 MED ORDER — HYDROMORPHONE HCL 1 MG/ML IJ SOLN
INTRAMUSCULAR | Status: AC
  Filled 2016-10-12: qty 1

## 2016-10-12 MED ORDER — SIMETHICONE 80 MG PO CHEW
80.0000 mg | CHEWABLE_TABLET | ORAL | Status: DC | PRN
Start: 2016-10-12 — End: 2016-10-15

## 2016-10-12 MED ORDER — TETANUS-DIPHTH-ACELL PERTUSSIS 5-2.5-18.5 LF-MCG/0.5 IM SUSP
0.5000 mL | Freq: Once | INTRAMUSCULAR | Status: DC
Start: 2016-10-13 — End: 2016-10-15

## 2016-10-12 MED ORDER — SENNOSIDES-DOCUSATE SODIUM 8.6-50 MG PO TABS
2.0000 | ORAL_TABLET | ORAL | Status: DC
  Administered 2016-10-12 – 2016-10-15 (×3): 2 via ORAL
  Filled 2016-10-12 (×3): qty 2

## 2016-10-12 MED ORDER — CEFAZOLIN SODIUM-DEXTROSE 2-3 GM-% IV SOLR
INTRAVENOUS | Status: DC | PRN
  Administered 2016-10-12: 2 g via INTRAVENOUS

## 2016-10-12 MED ORDER — PHENYLEPHRINE 40 MCG/ML (10ML) SYRINGE FOR IV PUSH (FOR BLOOD PRESSURE SUPPORT)
PREFILLED_SYRINGE | INTRAVENOUS | Status: AC
  Filled 2016-10-12: qty 20

## 2016-10-12 MED ORDER — WITCH HAZEL-GLYCERIN EX PADS: 1.0000 "application " | MEDICATED_PAD | CUTANEOUS | Status: DC | PRN

## 2016-10-12 MED ORDER — ONDANSETRON HCL 4 MG/2ML IJ SOLN: 4.0000 mg | Freq: Three times a day (TID) | INTRAMUSCULAR | Status: DC | PRN

## 2016-10-12 MED ORDER — FENTANYL CITRATE (PF) 100 MCG/2ML IJ SOLN
INTRAMUSCULAR | Status: AC
  Filled 2016-10-12: qty 2

## 2016-10-12 MED ORDER — DIBUCAINE 1 % RE OINT: 1.0000 "application " | TOPICAL_OINTMENT | RECTAL | Status: DC | PRN

## 2016-10-12 MED ORDER — ONDANSETRON HCL 4 MG/2ML IJ SOLN
INTRAMUSCULAR | Status: DC | PRN
  Administered 2016-10-12: 4 mg via INTRAVENOUS

## 2016-10-12 MED ORDER — DIPHENHYDRAMINE HCL 50 MG/ML IJ SOLN: 12.5000 mg | INTRAMUSCULAR | Status: DC | PRN

## 2016-10-12 MED ORDER — PRENATAL MULTIVITAMIN CH
1.0000 | ORAL_TABLET | Freq: Every day | ORAL | Status: DC
  Administered 2016-10-13 – 2016-10-14 (×2): 1 via ORAL
  Filled 2016-10-12 (×2): qty 1

## 2016-10-12 MED ORDER — SODIUM BICARBONATE 8.4 % IV SOLN
INTRAVENOUS | Status: DC | PRN
  Administered 2016-10-12 (×2): 5 mL via EPIDURAL

## 2016-10-12 MED ORDER — NALOXONE HCL 2 MG/2ML IJ SOSY
1.0000 ug/kg/h | PREFILLED_SYRINGE | INTRAVENOUS | Status: DC | PRN
  Filled 2016-10-12: qty 2

## 2016-10-12 MED ORDER — MAGNESIUM SULFATE BOLUS VIA INFUSION
2.0000 g | Freq: Once | INTRAVENOUS | Status: AC
  Administered 2016-10-12: 2 g via INTRAVENOUS
  Filled 2016-10-12: qty 500

## 2016-10-12 MED ORDER — ONDANSETRON HCL 4 MG/2ML IJ SOLN
INTRAMUSCULAR | Status: AC
  Filled 2016-10-12: qty 2

## 2016-10-12 MED ORDER — OXYTOCIN 10 UNIT/ML IJ SOLN
INTRAMUSCULAR | Status: AC
Start: 2016-10-12 — End: 2016-10-12
  Filled 2016-10-12: qty 4

## 2016-10-12 MED ORDER — DIPHENHYDRAMINE HCL 25 MG PO CAPS
25.0000 mg | ORAL_CAPSULE | ORAL | Status: DC | PRN
  Filled 2016-10-12: qty 1

## 2016-10-12 MED ORDER — PHENYLEPHRINE 8 MG IN D5W 100 ML (0.08MG/ML) PREMIX OPTIME
INJECTION | INTRAVENOUS | Status: DC | PRN
  Administered 2016-10-12: 60 ug/min via INTRAVENOUS

## 2016-10-12 MED ORDER — CEFAZOLIN SODIUM-DEXTROSE 2-4 GM/100ML-% IV SOLN
INTRAVENOUS | Status: AC
Start: 2016-10-12 — End: 2016-10-12
  Filled 2016-10-12: qty 100

## 2016-10-12 MED ORDER — LACTATED RINGERS IV SOLN
INTRAVENOUS | Status: DC
  Administered 2016-10-12 – 2016-10-13 (×2): via INTRAVENOUS

## 2016-10-12 MED ORDER — LACTATED RINGERS IV SOLN
INTRAVENOUS | Status: DC
  Administered 2016-10-12: 04:00:00 via INTRAUTERINE

## 2016-10-12 MED ORDER — PHENYLEPHRINE 8 MG IN D5W 100 ML (0.08MG/ML) PREMIX OPTIME
INJECTION | INTRAVENOUS | Status: AC
  Filled 2016-10-12: qty 100

## 2016-10-12 MED ORDER — CEPHALEXIN 500 MG PO CAPS
500.0000 mg | ORAL_CAPSULE | Freq: Every day | ORAL | Status: DC
  Administered 2016-10-12 – 2016-10-14 (×3): 500 mg via ORAL
  Filled 2016-10-12 (×3): qty 1

## 2016-10-12 MED ORDER — LACTATED RINGERS IV SOLN
INTRAVENOUS | Status: DC | PRN
  Administered 2016-10-12: 07:00:00 via INTRAVENOUS

## 2016-10-12 MED ORDER — MAGNESIUM SULFATE 50 % IJ SOLN
1.0000 g/h | INTRAVENOUS | Status: DC
  Administered 2016-10-12: 1 g/h via INTRAVENOUS

## 2016-10-12 MED ORDER — FENTANYL CITRATE (PF) 100 MCG/2ML IJ SOLN
INTRAMUSCULAR | Status: DC | PRN
  Administered 2016-10-12: 50 ug via INTRAVENOUS

## 2016-10-12 MED ORDER — ONDANSETRON HCL 4 MG/2ML IJ SOLN
4.0000 mg | Freq: Four times a day (QID) | INTRAMUSCULAR | Status: DC | PRN
  Administered 2016-10-12: 4 mg via INTRAVENOUS
  Filled 2016-10-12: qty 2

## 2016-10-12 SURGICAL SUPPLY — 46 items
BARRIER ADHS 3X4 INTERCEED (GAUZE/BANDAGES/DRESSINGS) ×6 IMPLANT
BENZOIN TINCTURE PRP APPL 2/3 (GAUZE/BANDAGES/DRESSINGS) ×3 IMPLANT
CHLORAPREP W/TINT 26ML (MISCELLANEOUS) ×3 IMPLANT
CLAMP CORD UMBIL (MISCELLANEOUS) IMPLANT
CLOSURE STERI STRIP 1/2 X4 (GAUZE/BANDAGES/DRESSINGS) ×3 IMPLANT
CLOSURE WOUND 1/2 X4 (GAUZE/BANDAGES/DRESSINGS)
CLOTH BEACON ORANGE TIMEOUT ST (SAFETY) ×3 IMPLANT
CONTAINER PREFILL 10% NBF 15ML (MISCELLANEOUS) IMPLANT
DRAPE C SECTION CLR SCREEN (DRAPES) ×3 IMPLANT
DRSG OPSITE POSTOP 4X10 (GAUZE/BANDAGES/DRESSINGS) ×3 IMPLANT
ELECT REM PT RETURN 9FT ADLT (ELECTROSURGICAL) ×3
ELECTRODE REM PT RTRN 9FT ADLT (ELECTROSURGICAL) ×1 IMPLANT
EXTRACTOR VACUUM M CUP 4 TUBE (SUCTIONS) IMPLANT
EXTRACTOR VACUUM M CUP 4' TUBE (SUCTIONS)
GLOVE BIOGEL PI IND STRL 7.0 (GLOVE) ×2 IMPLANT
GLOVE BIOGEL PI INDICATOR 7.0 (GLOVE) ×4
GLOVE ECLIPSE 6.5 STRL STRAW (GLOVE) ×3 IMPLANT
GOWN STRL REUS W/TWL LRG LVL3 (GOWN DISPOSABLE) ×6 IMPLANT
KIT ABG SYR 3ML LUER SLIP (SYRINGE) IMPLANT
NEEDLE HYPO 22GX1.5 SAFETY (NEEDLE) ×3 IMPLANT
NEEDLE HYPO 25X5/8 SAFETYGLIDE (NEEDLE) IMPLANT
NS IRRIG 1000ML POUR BTL (IV SOLUTION) ×3 IMPLANT
PACK C SECTION WH (CUSTOM PROCEDURE TRAY) ×3 IMPLANT
PAD OB MATERNITY 4.3X12.25 (PERSONAL CARE ITEMS) ×3 IMPLANT
RTRCTR C-SECT PINK 25CM LRG (MISCELLANEOUS) IMPLANT
SPONGE LAP 18X18 X RAY DECT (DISPOSABLE) ×3 IMPLANT
STRIP CLOSURE SKIN 1/2X4 (GAUZE/BANDAGES/DRESSINGS) IMPLANT
SUT CHROMIC GUT AB #0 18 (SUTURE) IMPLANT
SUT MNCRL 0 VIOLET CTX 36 (SUTURE) ×3 IMPLANT
SUT MON AB 2-0 SH 27 (SUTURE)
SUT MON AB 2-0 SH27 (SUTURE) IMPLANT
SUT MON AB 3-0 SH 27 (SUTURE)
SUT MON AB 3-0 SH27 (SUTURE) IMPLANT
SUT MON AB 4-0 PS1 27 (SUTURE) IMPLANT
SUT MONOCRYL 0 CTX 36 (SUTURE) ×6
SUT PLAIN 2 0 (SUTURE) ×2
SUT PLAIN 2 0 XLH (SUTURE) IMPLANT
SUT PLAIN ABS 2-0 CT1 27XMFL (SUTURE) ×1 IMPLANT
SUT VIC AB 0 CT1 36 (SUTURE) ×6 IMPLANT
SUT VIC AB 2-0 CT1 27 (SUTURE) ×2
SUT VIC AB 2-0 CT1 TAPERPNT 27 (SUTURE) ×1 IMPLANT
SUT VIC AB 4-0 KS 27 (SUTURE) ×3 IMPLANT
SUT VIC AB 4-0 PS2 27 (SUTURE) IMPLANT
SYR CONTROL 10ML LL (SYRINGE) ×3 IMPLANT
TOWEL OR 17X24 6PK STRL BLUE (TOWEL DISPOSABLE) ×3 IMPLANT
TRAY FOLEY CATH SILVER 14FR (SET/KITS/TRAYS/PACK) IMPLANT

## 2016-10-12 NOTE — Lactation Note (Signed)
This note was copied from a baby's chart. Lactation Consultation Note  Visited mother in PACU.  Baby is sleepy and not cueing at this point in time.  Able to hand express a few drops of colostrum and apply it to baby's lips.  He began to lick at the breast after about 20 minutes but was not very eager. Encouraged mother, PACU RN and AN RN to leave him Skin to skin as long as possible to promote BF. Patient Name: Melinda Lozano [id number]'s Date: 10/12/2016 Reason for consult: Initial assessment   Maternal Data Has patient been taught Hand Expression?: No (mom in PACU and had IV lines,BP cuff and baby in arms) Does the patient have breastfeeding experience prior to this delivery?: No  Feeding Feeding Type: Breast Fed  LATCH Score/Interventions                      Lactation Tools Discussed/Used     Consult Status Consult Status: Follow-up Date: 10/13/16 Follow-up type: In-patient    Melinda Lozano, Melinda Lozano 10/12/2016, 10:02 AM

## 2016-10-12 NOTE — Consult Note (Signed)
Neonatology Note:   Attendance at C-section:    I was asked by Dr. Cherly Hensenousins to attend this primary C/S at term due to pre-eclampsia and arrest of dilatation. 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 She was adequately treated with Pen G during induction, and was on Magnesium sulfate and Labetalol. Her Mg level was 6.2 prior to delivery. ROM 13 hours prior to delivery, fluid clear. Infant was floppy, blue, and apneic at birth, so delayed cord clamping was suspended. We bulb suctioned for removal of a large amount of thick mucous, then gave stimulation. The baby began to cry, slowly at first, but always maintaining HR > 100. He pinked up by 3 minutes and became more vigorous over time.  Ap 5/9. Lungs clear to ausc in DR, without distress, and regular. To CN to care of Pediatrician.   Melinda Souhristie C. Ashaunti Treptow, MD

## 2016-10-12 NOTE — Transfer of Care (Signed)
Immediate Anesthesia Transfer of Care Note  Patient: Melinda Lozano  Procedure(s) Performed: Procedure(s): CESAREAN SECTION (N/A)  Patient Location: PACU  Anesthesia Type:Epidural  Level of Consciousness: awake  Airway & Oxygen Therapy: Patient Spontanous Breathing  Post-op Assessment: Report given to RN and Post -op Vital signs reviewed and stable  Post vital signs: Reviewed  Last Vitals:  Vitals:   10/12/16 0701 10/12/16 0720  BP: 124/83   Pulse: 92   Resp: 18 18  Temp:      Last Pain:  Vitals:   10/12/16 0720  TempSrc:   PainSc: 0-No pain         Complications: No apparent anesthesia complications

## 2016-10-12 NOTE — Progress Notes (Signed)
S; noted numbness of tongue C/o pressure with ctx  O: BP 135/74   Pulse 84   Temp 98.8 F (37.1 C) (Oral)   Resp 18   Ht 5\' 3"  (1.6 m)   Wt 82.1 kg (181 lb)   LMP 01/15/2016   BMI 32.06 kg/m  VE 4/70/-1 Pitocin Magnesium 2g/hr  Tracing: baseline 140 (+) variables occ late good variability Ctx q 4 mins  IMP: Variable decelerations related to cord compression Preeclampsia on Magnesium. Last mg level was sub optimal and bolus given IUP @ 38 5/7 weeks GBS cx (+) on IV PCN P) amnioinfusion. Right exaggerated sims. Cont with pitocin. Check magnesium level

## 2016-10-12 NOTE — Anesthesia Postprocedure Evaluation (Signed)
Anesthesia Post Note  Patient: Melinda Lozano  Procedure(s) Performed: Procedure(s) (LRB): CESAREAN SECTION (N/A)  Patient location during evaluation: [facility] Anesthesia Type: Epidural Level of consciousness: awake and alert Pain management: pain level controlled Vital Signs Assessment: post-procedure vital signs reviewed and stable Respiratory status: spontaneous breathing Cardiovascular status: blood pressure returned to baseline Postop Assessment: no headache, no backache, spinal receding and no signs of nausea or vomiting Anesthetic complications: no        Last Vitals:  Vitals:   10/12/16 1128 10/12/16 1241  BP: (!) 144/85 134/76  Pulse: 84 88  Resp: 18 20  Temp: 36.9 C 36.7 C    Last Pain:  Vitals:   10/12/16 1241  TempSrc: Oral  PainSc:    Pain Goal: Patients Stated Pain Goal: 5 (10/12/16 1030)               Azariel Banik

## 2016-10-12 NOTE — Lactation Note (Signed)
This note was copied from a baby's chart. Lactation Consultation Note  Patient Name: Melinda Lozano ZOXWR'UToday's Date: 10/12/2016 Reason for consult: Initial assessment;Breast surgery (Breast reduction 2000) Breastfeeding consultation services and support information given and reviewed.  Mom has hx of AMA, IVF with egg donor, breast reduction in 2000.  This is mom's first baby and newborn is 236 hours old.  Baby is currently sleeping on mom's chest skin to skin.  Baby picked up and he started to cue.  Mom has firm breasts with taut tissue that is difficult to compress.  Baby positioned in football hold on left side. A drop of colostrum hand expressed.  Baby unable to grasp nipple so a 20 mm nipple shield applied.  Baby latched with shield and nursed for 15 minutes.  Colostrum in shield after feeding.  Manual pump given with instructions to use for a few minutes prior to latching.  Mom is very sleepy and nauseated so minimal teaching done at this visit.  Encouraged to call out with concerns/assist prn.  Maternal Data Has patient been taught Hand Expression?: Yes Does the patient have breastfeeding experience prior to this delivery?: No  Feeding Feeding Type: Breast Fed Length of feed: 15 min  LATCH Score/Interventions Latch: Repeated attempts needed to sustain latch, nipple held in mouth throughout feeding, stimulation needed to elicit sucking reflex. Intervention(s): Adjust position;Assist with latch;Breast massage;Breast compression  Audible Swallowing: A few with stimulation Intervention(s): Skin to skin;Hand expression Intervention(s): Alternate breast massage  Type of Nipple: Everted at rest and after stimulation (short)  Comfort (Breast/Nipple): Soft / non-tender     Hold (Positioning): Assistance needed to correctly position infant at breast and maintain latch. Intervention(s): Breastfeeding basics reviewed;Support Pillows;Position options;Skin to skin  LATCH Score:  7  Lactation Tools Discussed/Used Tools: Nipple Shields Nipple shield size: 20 Pump Review: Setup, frequency, and cleaning Initiated by:: LC Date initiated:: 10/12/16   Consult Status Consult Status: Follow-up Date: 10/13/16 Follow-up type: In-patient    Huston FoleyMOULDEN, Imogen Maddalena S 10/12/2016, 2:18 PM

## 2016-10-12 NOTE — Progress Notes (Signed)
S; magnesium discontinued due to 6.2  O: BP 124/83   Pulse 92   Temp 98.8 F (37.1 C) (Oral)   Resp 18   Ht 5\' 3"  (1.6 m)   Wt 82.1 kg (181 lb)   LMP 01/15/2016   Breastfeeding? Unknown   BMI 32.06 kg/m  Pitocin 32 miu VE 4/70/-1/0 Tracing: baseline 140  Intermittent variables Ctx undulating  CBC    Component Value Date/Time   WBC 12.1 (H) 10/12/2016 0513   RBC 4.37 10/12/2016 0513   HGB 13.7 10/12/2016 0513   HCT 38.5 10/12/2016 0513   PLT 133 (L) 10/12/2016 0513   MCV 88.1 10/12/2016 0513   MCH 31.4 10/12/2016 0513   MCHC 35.6 10/12/2016 0513   RDW 15.3 10/12/2016 0513   ImP: arrest of dilation GBS cx (+) Preeclampsia P) proceed with primary C/S.  Resume magnesium and labetalol post delivery. Risk of surgery includes infection, bleeding, injury to bladder, bowel, ureters. Poss need for blood transfusion and its risk, internal scar tissue. All / answered. Consent signed

## 2016-10-12 NOTE — Brief Op Note (Signed)
10/10/2016 - 10/12/2016  8:27 AM  PATIENT:  Melinda Lozano  46 y.o. female  PRE-OPERATIVE DIAGNOSIS:  Arrest of dilation, preeclampsia, term gestation  POST-OPERATIVE DIAGNOSIS:  Same, LOT arrest  PROCEDURE:  Primary Cesarean section, kerr hysterotomy  SURGEON:  Surgeon(s) and Role:    * Maxie BetterSheronette Zacarias Krauter, MD - Primary  PHYSICIAN ASSISTANT:   ASSISTANTS: Arlan Organaniela Paul, CNM   ANESTHESIA:   epidural Findings: live female  LOT CAN x 1. Apgar 5/9, manual removal of placenta. Nl tubes and ovaries EBL:  Total I/O In: 300 [I.V.:300] Out: 900 [Urine:250; Blood:650]  BLOOD ADMINISTERED:none  DRAINS: none   LOCAL MEDICATIONS USED:  MARCAINE    and OTHER kenalog  SPECIMEN:  No Specimen  DISPOSITION OF SPECIMEN:  N/A  COUNTS:  YES  TOURNIQUET:  * No tourniquets in log *  DICTATION: .Other Dictation: Dictation Number [id number]  PLAN OF CARE: Admit to inpatient   PATIENT DISPOSITION:  PACU - hemodynamically stable.   Delay start of Pharmacological VTE agent (>24hrs) due to surgical blood loss or risk of bleeding: no

## 2016-10-13 ENCOUNTER — Encounter (HOSPITAL_COMMUNITY): Payer: Self-pay | Admitting: *Deleted

## 2016-10-13 LAB — CBC
HEMATOCRIT: 33.6 % — AB (ref 36.0–46.0)
HEMOGLOBIN: 11.9 g/dL — AB (ref 12.0–15.0)
MCH: 31 pg (ref 26.0–34.0)
MCHC: 35.4 g/dL (ref 30.0–36.0)
MCV: 87.5 fL (ref 78.0–100.0)
Platelets: 127 10*3/uL — ABNORMAL LOW (ref 150–400)
RBC: 3.84 MIL/uL — AB (ref 3.87–5.11)
RDW: 15.5 % (ref 11.5–15.5)
WBC: 15.8 10*3/uL — ABNORMAL HIGH (ref 4.0–10.5)

## 2016-10-13 LAB — BIRTH TISSUE RECOVERY COLLECTION (PLACENTA DONATION)

## 2016-10-13 LAB — RPR: RPR Ser Ql: NONREACTIVE

## 2016-10-13 NOTE — Lactation Note (Addendum)
This note was copied from a baby's chart. Lactation Consultation Note  Patient Name: Boy Samuella Cotaara Hill-Starks [id number]'WToday's Date: 10/13/2016 Reason for consult: Follow-up assessment  Baby 27 hours old. Mom reports that she had breast reduction 17 years ago, and her nipples were removed. Discussed with mom how this can impact supply, and enc to continue using DEBP and hand expression. Parents report that baby has been sleepy at breast and not willing to attempt to nurse for more than may 10 minutes. Assisted with latching baby first without shield, then with #20 and #24 in football and in cross-cradle. Baby remained sleepy at breast, and only able to hand express a few drops of EBM, so parents agreeable to supplementing. Assisted parents with pre-filling NS with curve-tipped syringe, and baby able to transfer about 1 ml. Assisted parents with finger and syringe feeding, and baby able to transfer 4 ml--and baby tolerated well.  Feeding plan: enc parents to put baby to breast with cues and nurse using the #20 NS. Enc supplementing with EBM/formula in the NS at breast if baby willing, and by finger if baby not actively nursing at the breast. Enc mom to post-pump after each feeding followed by hand expression. Parents given supplementation guidelines with review. Patient's bedside nurse, Selena BattenKim, RN aware of assessment and interventions.   Maternal Data    Feeding Feeding Type: Breast Fed Length of feed: 0 min  LATCH Score/Interventions Latch: Too sleepy or reluctant, no latch achieved, no sucking elicited. Intervention(s): Skin to skin;Waking techniques Intervention(s): Adjust position;Assist with latch;Breast compression  Audible Swallowing: None Intervention(s): Skin to skin;Hand expression  Type of Nipple: Flat Intervention(s): Double electric pump;Hand pump  Comfort (Breast/Nipple): Soft / non-tender     Hold (Positioning): Assistance needed to correctly position infant at breast and maintain  latch. Intervention(s): Breastfeeding basics reviewed;Support Pillows;Position options;Skin to skin  LATCH Score: 4  Lactation Tools Discussed/Used     Consult Status      Sherlyn HayJennifer D Iysha Mishkin 10/13/2016, 12:47 PM

## 2016-10-13 NOTE — Progress Notes (Signed)
Subjective: POD# 1 Information for the patient's newborn:  Melinda Lozano, Boy Melinda Lozano [[id number]][[id number]]  female   circ planned  [name] 3rd Reports feeling tired but well.    Feeding: breast Patient reports tolerating PO.  Breast symptoms: positive colostrum, working with LC using nipple shield for latch. Pain controlled with motrin and Percocet Denies HA/SOB/C/P/N/V/dizziness. Flatus absent. She reports vaginal bleeding as normal, without clots.  She is ambulating, up to bathroom for pericare, Foley cath in place.   Objective:   VS:    Vitals:   10/13/16 0300 10/13/16 0400 10/13/16 0602 10/13/16 0800  BP:   121/76 118/69  Pulse:   76 83  Resp: 15 15 16 18   Temp:   98.8 F (37.1 C) 98.6 F (37 C)  TempSrc:   Oral Oral  SpO2: 98% 98% 96% 95%  Weight:      Height:         Intake/Output Summary (Last 24 hours) at 10/13/16 0856 Last data filed at 10/13/16 0842  Gross per 24 hour  Intake           3871.4 ml  Output             4200 ml  Net           -328.6 ml        Recent Labs  10/12/16 1002 10/13/16 0510  WBC 15.0* 15.8*  HGB 14.2 11.9*  HCT 40.3 33.6*  PLT 123* 127*     Blood type: A/Positive/-- (07/14 0000)  Rubella: Immune (07/14 0000)     Physical Exam:  General: alert, cooperative and no distress CV: Regular rate and rhythm, S1S2 present or without murmur or extra heart sounds Resp: clear Abdomen: soft, nontender, normal bowel sounds Incision: clean, dry and intact Uterine Fundus: firm, below umbilicus, nontender Lochia: minimal Ext: edema +2 pedal and pretibial, no calf tenderness   Assessment/Plan: 46 y.o.   POD# 1. G1P1000                  Principal Problem:   Postpartum care following cesarean delivery (1/26)  Arrest of dilation, delivered, current hospitalization   Doing well, stable.                Advance diet as tolerated  Encourage rest when baby rests  Breastfeeding support  Encourage to ambulate  Routine post-op care  Active  Problems:   Preeclampsia, third trimester  -resolving bp stable, diuresing well over last 24 hrs mag sulfate since delivery  -dc mag sulfate, continue strict I&O      Apolonio Schneidersinthya Bowmaker-Kareen, CNM, MSN 10/13/2016, 8:56 AM

## 2016-10-13 NOTE — Op Note (Signed)
NAMEKYNDAHL, JABLON            ACCOUNT NO.:  [id number]  MEDICAL RECORD NO.:  [id number]  LOCATION:  9170                          FACILITY:  [facility]  PHYSICIAN:  Maxie Better, M.D.DATE OF BIRTH:  1971/03/31  DATE OF PROCEDURE:  10/12/2016 DATE OF DISCHARGE:                              OPERATIVE REPORT   PREOPERATIVE DIAGNOSES:  Arrest of dilatation, preeclampsia, term gestation.  POSTOPERATIVE DIAGNOSES:  Arrest of dilatation, preeclampsia, term gestation,  left occiput transverse arrest.  PROCEDURE:  Primary cesarean section, Kerr hysterotomy.  ANESTHESIA:  Epidural.  SURGEON:  Maxie Better, M.D.  ASSISTANT:  Arlan Organ, CNM.  DESCRIPTION OF PROCEDURE:  Under adequate epidural anesthesia, the patient was placed in the supine position with a left lateral tilt.  She was sterilely prepped and draped in usual fashion.  An indwelling Foley catheter was already in place.  A 0.25% Marcaine was injected along the planned Pfannenstiel skin incision site.  A Pfannenstiel skin incision was then made and carried down to the rectus fascia.  Rectus fascia was opened transversely.  The rectus fascia was then bluntly and sharply dissected off the rectus muscle in a superior and inferior fashion.  The rectus muscle split in the midline.  The parietal peritoneum was entered sharply and extended.  A self-retaining Alexis retractor was then placed.  The vesicouterine peritoneum was opened transversely.  The bladder was gently displaced inferiorly with blunt dissection. Palpation of lower uterine segment was notable for a large palpable mass in the right gutter which was consistent actually with the displacement of the baby's head.  A curvilinear low transverse uterine incision was then made and extended with bandage scissors.  Subsequent delivery of a live female from the left occiput transverse position with nuchal cord x1, which was reducible was accomplished.  Due the baby  being floppy, the cord was clamped, cut.  The baby was transferred to the awaiting pediatricians who subsequently assigned Apgars of 5 and 9 at 1 and 5 minutes respectively.  The placenta which was anterior was manually removed.  Uterine cavity was cleaned of debris.  Uterine incision had no extension and was closed in 2 layers, the first layer with 0 Monocryl running locked stitch, and the second layer was imbricated using 0 Monocryl suture.  On the right angle, there was some bleeding in which a figure-of-eight suture was placed with good hemostasis noted.  Normal tubes and ovaries were noted bilaterally.  The abdomen was irrigated and suctioned of debris.  The Alexis retractor was removed.  The parietal peritoneum was identified.  Lower uterine segment inverted T Interceed placement was done.  A 2-0 Vicryl was used to close the parietal peritoneum.  The rectus fascia was closed with 0 Vicryl x2.  The subcutaneous area was irrigated, small bleeders cauterized, interrupted 2-0 plain sutures placed. The skin which was then injected with Kenalog 40, was then approximated using 4-0 Vicryl subcuticular closure. Steri-Strips and benzoin were then placed.  SPECIMEN:  Placenta not sent to Pathology.  ESTIMATED BLOOD LOSS:  600 mL.  INTRAOPERATIVE FLUIDS:  500 mL.  URINE OUTPUT:  50 mL slightly blood tinged.  COUNTS:  Sponge and instrument counts x2 was correct.  COMPLICATION:  None.  The patient tolerated the procedure well, was transferred to recovery room in stable condition. Baby was placed skin to skin.     Maxie BetterSheronette Machai Desmith, M.D.     [place]/MEDQ  D:  10/12/2016  T:  10/13/2016  Job:  [id number]

## 2016-10-14 NOTE — Lactation Note (Addendum)
This note was copied from a baby's chart. Lactation Consultation Note  Patient Name: Boy Samuella Cotaara Hill-Starks [id number]'XToday's Date: 10/14/2016 Reason for consult: Follow-up assessment Baby 56 hours old. Parents report that baby is going to breast first, and being given some formula in #24 NS to enc suckling at breast. Then, parents are supplementing additional formula by bottle. However, mom has only used DEBP once today. Enc mom to post-pump after each feeding. Mom reports that she has a Medela personal pump at home. Mom aware of OP/BFSG and LC phone line assistance after D/C.   Maternal Data    Feeding Feeding Type: Breast Fed Length of feed: 7 min  LATCH Score/Interventions                      Lactation Tools Discussed/Used Tools: Nipple Shields   Consult Status Consult Status: Follow-up Date: 10/15/16 Follow-up type: In-patient    Sherlyn HayJennifer D Schyler Counsell 10/14/2016, 4:24 PM

## 2016-10-14 NOTE — Progress Notes (Signed)
Subjective: POD# 2 Information for the patient's newborn:  Melinda Lozano, Melinda Lozano [[id number]]  female   circ completed Baby name: Melinda Lozano  Reports feeling well. Feeding: breast and bottle Patient reports tolerating PO.  Breast symptoms: minimal expression, hx of breast redux, working w/ LC Pain controlled with PO meds Denies HA/SOB/C/P/N/V/dizziness. Flatus present, no BM. She reports vaginal bleeding as normal, without clots.  She is ambulating, urinating without difficulty.     Objective:   VS:    Vitals:   10/13/16 2030 10/14/16 0100 10/14/16 0416 10/14/16 0745  BP: 134/79 113/67 114/68 133/81  Pulse: 71 83 81 79  Resp: 20 18 15 16   Temp: 98.7 F (37.1 C) 98.9 F (37.2 C) 98.8 F (37.1 C) 98.2 F (36.8 C)  TempSrc: Oral Oral Oral Oral  SpO2: 98% 98% 99% 97%  Weight:      Height:         Intake/Output Summary (Last 24 hours) at 10/14/16 0935 Last data filed at 10/14/16 0600  Gross per 24 hour  Intake             1160 ml  Output             4000 ml  Net            -2840 ml        Recent Labs  10/12/16 1002 10/13/16 0510  WBC 15.0* 15.8*  HGB 14.2 11.9*  HCT 40.3 33.6*  PLT 123* 127*     Blood type: A/Positive/-- (07/14 0000)  Rubella: Immune (07/14 0000)     Physical Exam:  General: alert, cooperative and no distress Abdomen: soft, nontender, normal bowel sounds Incision: serous and over 50% of dressing drainage present Uterine Fundus: firm, below umbilicus, nontender Lochia: minimal Ext: edema trace pedal   Assessment/Plan: 46 y.o.   POD# 2. G1P1000                  Principal Problem:   Postpartum care following cesarean delivery (1/26) Active Problems:   Arrest of dilation, delivered, current hospitalization  Doing well, stable.     Change HC dressing  DC IV site  Encourage rest when baby rests  Breastfeeding support  Encourage to ambulate  Routine post-op care     Preeclampsia, third trimester  Resolving, good diuresis and  normotensive  [facility] week for BP check  Anticipate DC in AM     Neta Mendsaniela C Broughton Eppinger, CNM, MSN 10/14/2016, 9:35 AM

## 2016-10-14 NOTE — Lactation Note (Signed)
This note was copied from a baby's chart. Lactation Consultation Note  Patient Name: Melinda Lozano Reason for consult: Follow-up assessment  Baby 58 hours old. Brought parents a 5 [place] feeding system and review for use with NS for supplementing larger amounts with each feeding. Parents report that this system "is made for them."  Maternal Data    Feeding Feeding Type: Breast Fed Length of feed: 10 min  LATCH Score/Interventions                      Lactation Tools Discussed/Used     Consult Status Consult Status: Follow-up Date: 10/15/16 Follow-up type: In-patient    Sherlyn HayJennifer D Cyrstal Leitz Lozano, 6:10 PM

## 2016-10-15 MED ORDER — OXYCODONE-ACETAMINOPHEN 5-325 MG PO TABS: 1.0000 | ORAL_TABLET | ORAL | 0 refills | Status: AC | PRN

## 2016-10-15 MED ORDER — LABETALOL HCL 100 MG PO TABS: 100.0000 mg | ORAL_TABLET | Freq: Two times a day (BID) | ORAL | 0 refills | Status: AC

## 2016-10-15 MED ORDER — IBUPROFEN 600 MG PO TABS: 600.0000 mg | ORAL_TABLET | Freq: Four times a day (QID) | ORAL | 0 refills | Status: AC

## 2016-10-15 NOTE — Discharge Summary (Signed)
POSTOPERATIVE DISCHARGE SUMMARY:  Patient ID: Melinda Lozano MRN: [id number] DOB/AGE: September 04, 1971 46 y.o.  Admit date: 10/10/2016 Admission Diagnoses: preeclampsia  Discharge date:  10/15/2016 Discharge Diagnoses: POD 3 s/p c-section- arrest of dilation Preeclampsia-improving / hypertension stable  Prenatal history: G1P1000   EDC : 10/21/2016, by Last Menstrual Period  [facility] at [facility]  Primary provider : Cherly Hensen Prenatal course complicated by [facility] / IVF pregnancy / + GBS  Prenatal Labs: ABO, Rh: A/Positive/-- (07/14 0000)  Antibody: Negative (07/14 0000) Rubella: Immune (07/14 0000)  RPR: Non Reactive (01/27 0510)  HBsAg: Negative (07/14 0000)  HIV: Non-reactive (07/14 0000)  GBS: Positive (08/24 0000)   Medical / Surgical History :  Past medical history:  Past Medical History:  Diagnosis Date  . Arrest of dilation, delivered, current hospitalization 10/12/2016  . Medical history non-contributory   . Postpartum care following cesarean delivery (1/26) 10/12/2016    Past surgical history:  Past Surgical History:  Procedure Laterality Date  . BREAST REDUCTION SURGERY Bilateral 2000  . CESAREAN SECTION N/A 10/12/2016   Procedure: CESAREAN SECTION;  Surgeon: Maxie Better, MD;  Location: [facility];  Service: Obstetrics;  Laterality: N/A;  . SKIN GRAFT Right 1994   skin graft taken off of right hip and put on right index finger    Family History: History reviewed. No pertinent family history.  Social History:  reports that she has never smoked. She has never used smokeless tobacco. She reports that she does not drink alcohol or use drugs.  Allergies: Patient has no known allergies.   Current Medications at time of admission:  Prior to Admission medications   Medication Sig Start Date End Date Taking? Authorizing Provider  cephALEXin (KEFLEX) 500 MG capsule Take 500 mg by mouth at bedtime.   Yes Historical Provider, MD  Prenatal  Vit-Fe Fumarate-FA (PRENATAL MULTIVITAMIN) TABS tablet Take 1 tablet by mouth every morning.   Yes Historical Provider, MD  ibuprofen (ADVIL,MOTRIN) 600 MG tablet Take 1 tablet (600 mg total) by mouth every 6 (six) hours. 10/15/16   Marlinda Mike, CNM  labetalol (NORMODYNE) 100 MG tablet Take 1 tablet (100 mg total) by mouth 2 (two) times daily. 10/15/16   Marlinda Mike, CNM  oxyCODONE-acetaminophen (PERCOCET/ROXICET) 5-325 MG tablet Take 1 tablet by mouth every 4 (four) hours as needed (pain scale 4-7). 10/15/16   Marlinda Mike, CNM    Intrapartum Course:  Admit for induction of labor with arrest of cervical dilation  Pain management: epidural Complicated by: preeclampsia Interventions required: magnesium sulfate prophylaxis / cesarean delivery  Procedures: Cesarean section delivery on 10/12/2016 with delivery of  viable female newborn by Dr Cherly Hensen   See operative report for further details APGAR (1 MIN): 5   APGAR (5 MINS): 9    Postoperative / postpartum course:  Uncomplicated with discharge on POD 3  Discharge Instructions:  Discharged Condition: stable  Activity: pelvic rest and postoperative restrictions x 2   Diet: routine  Medications:  Allergies as of 10/15/2016   No Known Allergies     Medication List    TAKE these medications   cephALEXin 500 MG capsule Commonly known as:  KEFLEX Take 500 mg by mouth at bedtime.   ibuprofen 600 MG tablet Commonly known as:  ADVIL,MOTRIN Take 1 tablet (600 mg total) by mouth every 6 (six) hours.   labetalol 100 MG tablet Commonly known as:  NORMODYNE Take 1 tablet (100 mg total) by mouth 2 (two) times daily.   oxyCODONE-acetaminophen  5-325 MG tablet Commonly known as:  PERCOCET/ROXICET Take 1 tablet by mouth every 4 (four) hours as needed (pain scale 4-7).   prenatal multivitamin Tabs tablet Take 1 tablet by mouth every morning.       Wound Care: keep clean and dry / remove honeycomb POD 5 Postpartum Instructions:  [facility] discharge booklet - instructions reviewed  Discharge to: Home  Follow up :  [facility] in 1 week for interval visit with nurse for BP visit [facility] in 6 weeks for routine postpartum visit with Dr Cherly Hensenousins                Signed: Marlinda MikeBAILEY, TANYA CNM, MSN, [facility] Hospital[facility]ACNM 10/15/2016, 9:00 AM

## 2016-10-15 NOTE — Progress Notes (Signed)
POSTOPERATIVE DAY # 3 S/P CS - arrest of labor / PEC   S:         Reports feeling ok - ready to go home             Tolerating po intake / no nausea / no vomiting / + flatus / no BM             Bleeding is light             Pain controlled with Motrin and oxycodone             Up ad lib / ambulatory/ voiding QS  Newborn breast feeding    O:  VS: BP 110/67 (BP Location: Right Arm)   Pulse 75   Temp 98 F (36.7 C) (Rectal)   Resp 15   Ht 5\' 3"  (1.6 m)   Wt 82.1 kg (181 lb)   LMP 01/15/2016   SpO2 99%   Breastfeeding? Unknown   BMI 32.06 kg/m    LABS:               Recent Labs  10/12/16 1002 10/13/16 0510  WBC 15.0* 15.8*  HGB 14.2 11.9*  PLT 123* 127*               Bloodtype: A/Positive/-- (07/14 0000)  Rubella: Immune (07/14 0000)                                             I&O: Intake/Output      01/28 0701 - 01/29 0700 01/29 0701 - 01/30 0700   P.O.     Total Intake(mL/kg)     Urine (mL/kg/hr) 1000 (0.5)    Total Output 1000     Net -1000                     Physical Exam:             Alert and Oriented X3  Abdomen: soft, non-tender, non-distended              Fundus: firm, non-tender, Ueven             Dressing intact              Incision:  approximated with suture / no erythema / no ecchymosis / no drainage  Perineum: intact  Lochia: light  Extremities: no edema, no calf pain or tenderness, negative Homans  A:        POD # 3 S/P CS             P:        Routine postoperative care              DC home     Marlinda MikeBAILEY, Macauley Mossberg CNM, MSN, [facility] Center[facility]ACNM 10/15/2016, 8:25 AM

## 2017-03-29 NOTE — Addendum Note (Signed)
Addendum  created 03/29/17 1415 by Cristela BlueJackson, Taesean Reth, MD   Sign clinical note

## 2017-03-29 NOTE — Anesthesia Postprocedure Evaluation (Signed)
Anesthesia Post Note  Patient: Melinda Lozano  Procedure(s) Performed: Procedure(s) (LRB): CESAREAN SECTION (N/A)     Anesthesia Post Evaluation  Last Vitals:  Vitals:   10/14/16 1958 10/15/16 0825  BP: 110/67 135/80  Pulse: 75 81  Resp: 15 16  Temp: 36.7 C 37.1 C    Last Pain:  Vitals:   10/15/16 0954  TempSrc:   PainSc: 7                  Yannick Steuber EDWARD

## 2018-06-28 IMAGING — US US MFM FETAL BPP W/O NON-STRESS
1 series · 14 of 19 positions shown · non-contrast
Comparison: none

[Series 1: us mfm fetal bpp w/o non-stress · 19 acquisitions, 14 frames shown]
[im 1/19]
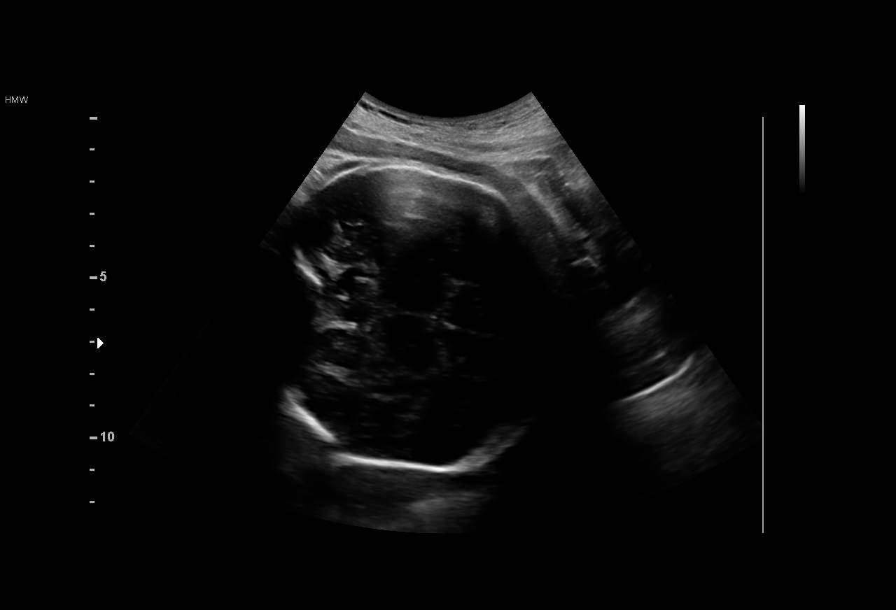
[im 3/19]
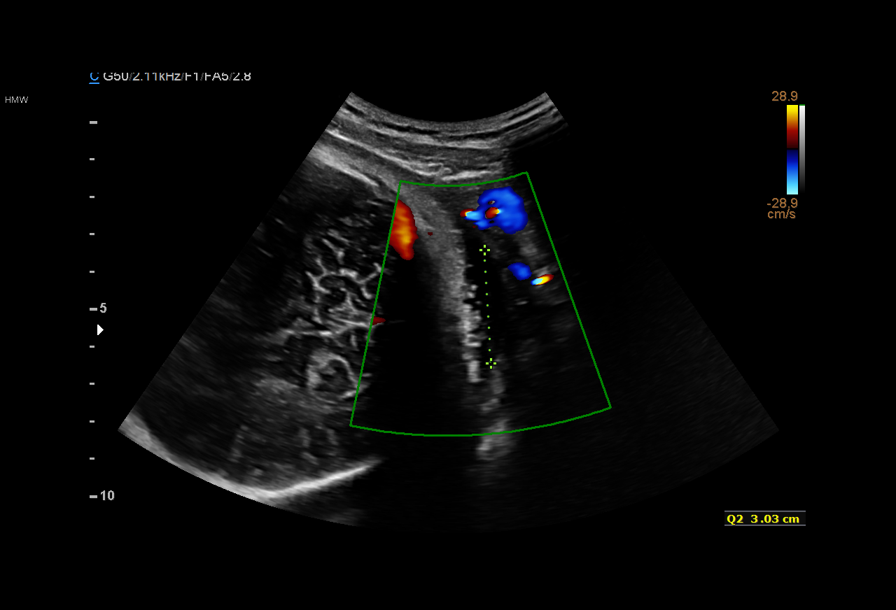
[im 4/19]
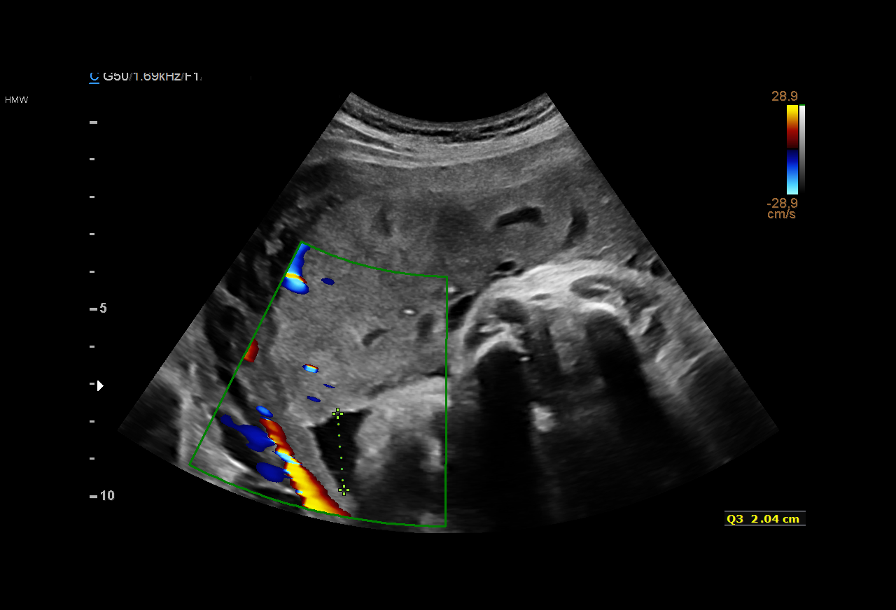
[im 5/19]
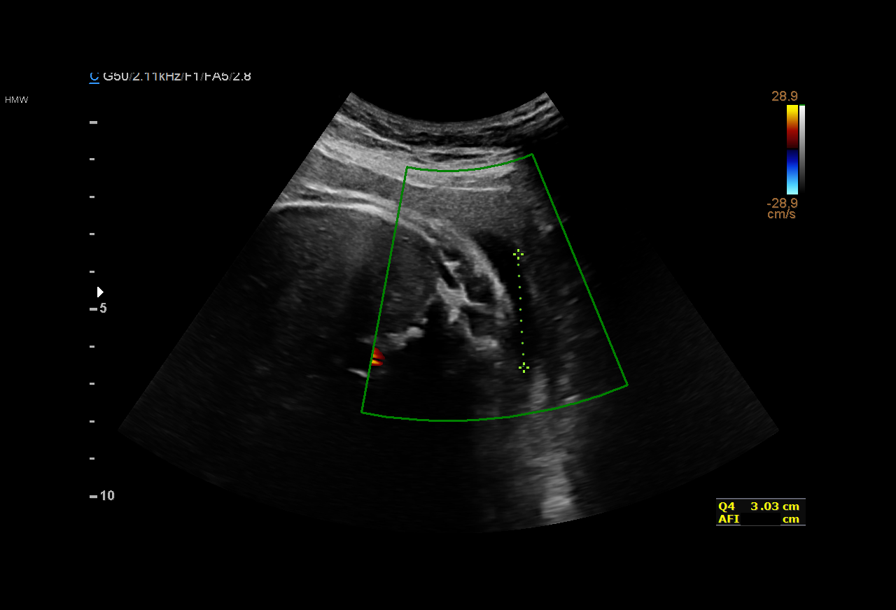
[im 7/19]
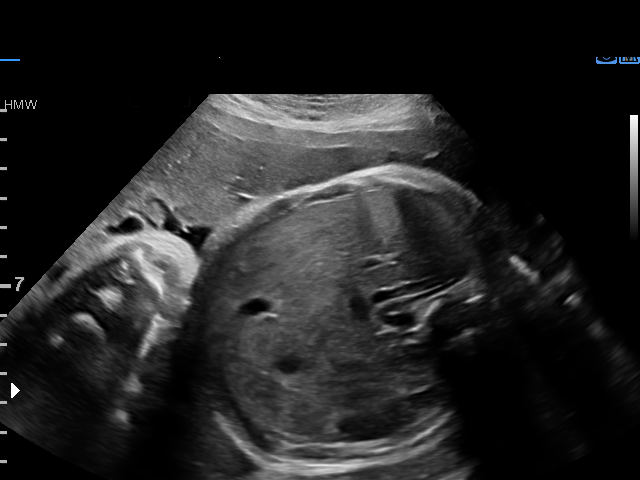
[im 8/19]
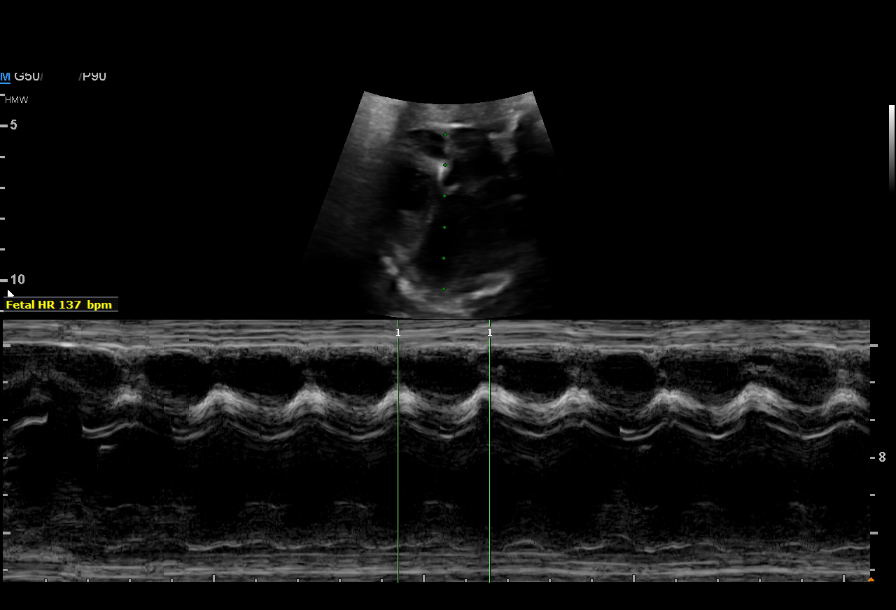
[im 9/19]
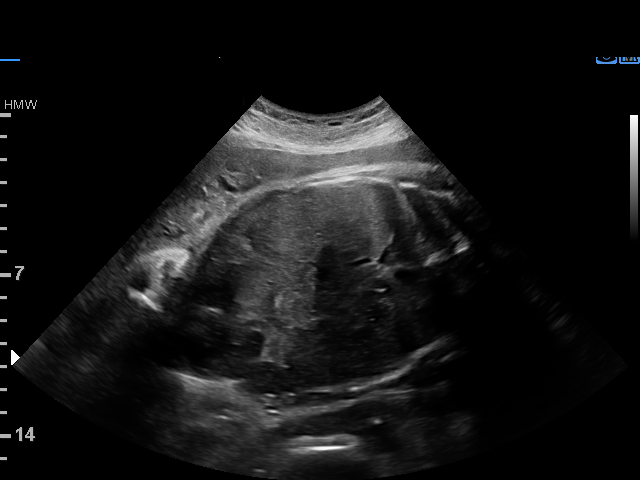
[im 11/19]
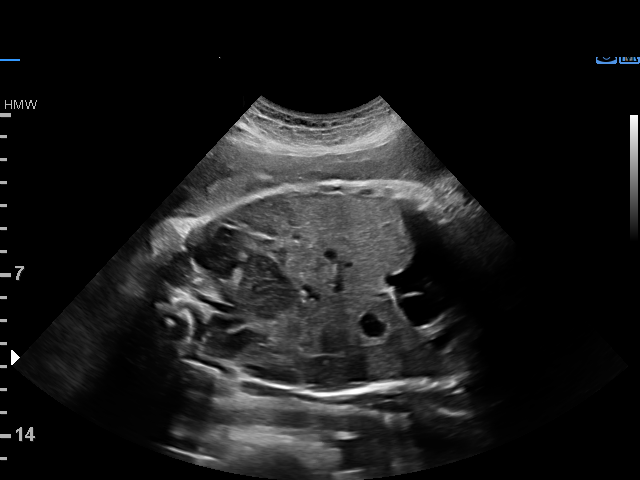
[im 12/19]
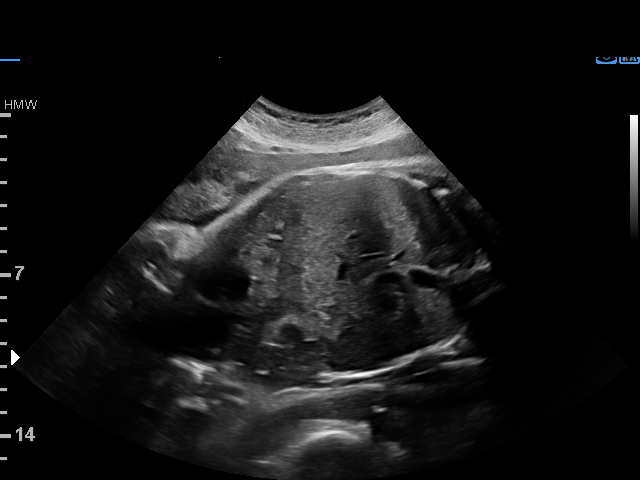
[im 13/19]
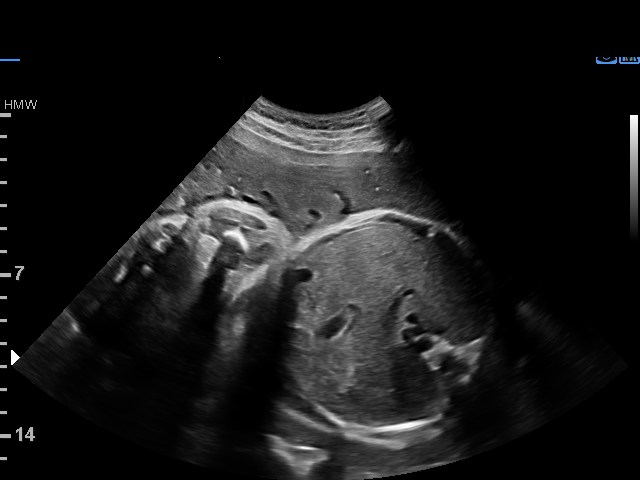
[im 15/19]
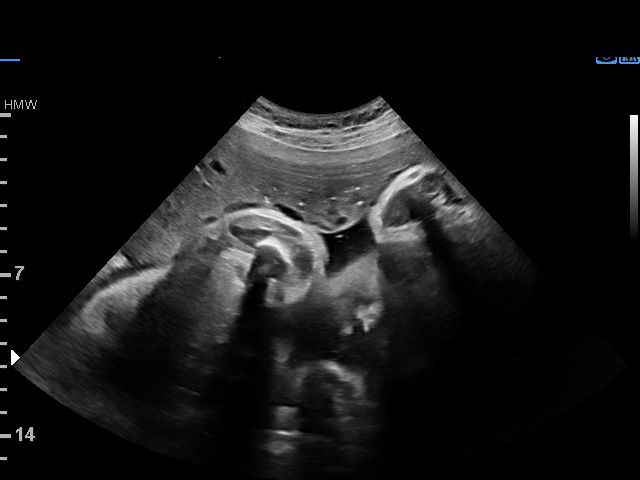
[im 16/19]
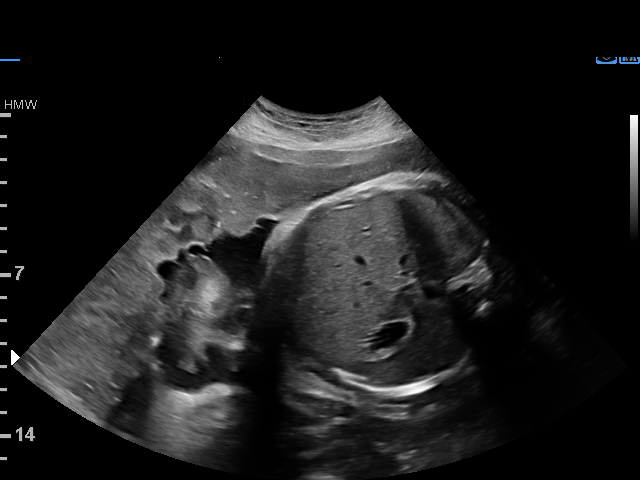
[im 17/19]
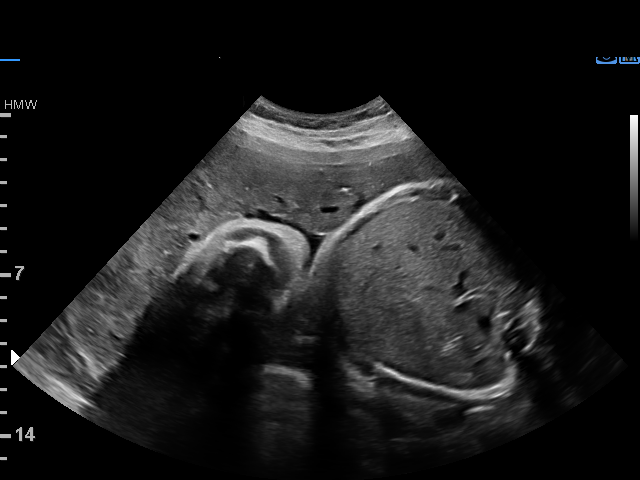
[im 19/19]
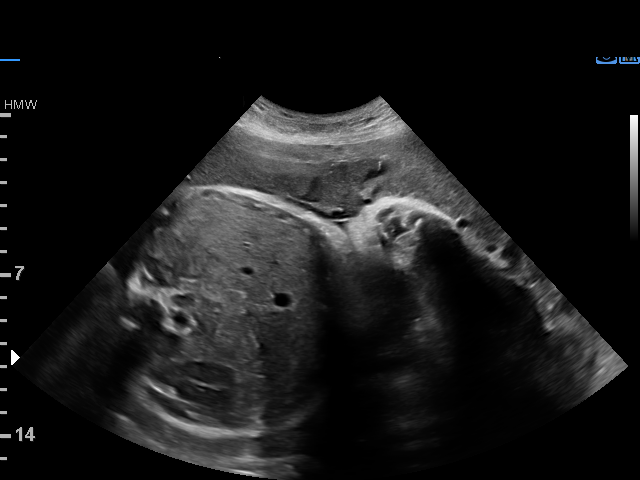

[14 of 19 positions shown; findings below may reference images not displayed]

[facility]

1  RUBCIC               [id number]      [id number]     [id number]
YUSBELY
Indications

38 weeks gestation of pregnancy
Non-reactive NST
OB History

Gravidity:    1         Term:   0        Prem:   0        SAB:   0
TOP:          0       Ectopic:  0        Living: 0
Fetal Evaluation

Num Of Fetuses:     1
Fetal Heart         137
Rate(bpm):
Cardiac Activity:   Observed
Presentation:       Cephalic

Amniotic Fluid
AFI FV:      Subjectively within normal limits

AFI Sum(cm)     %Tile       Largest Pocket(cm)
11.68           40

RUQ(cm)       RLQ(cm)       LUQ(cm)        LLQ(cm)
3.58
Biophysical Evaluation

Amniotic F.V:   Within normal limits       F. Tone:        Observed
F. Movement:    Observed                   Score:          [DATE]
F. Breathing:   Observed
Gestational Age

LMP:           38w 3d       Date:   01/15/16                 EDD:   10/21/16
Best:          38w 3d    Det. By:   LMP  (01/15/16)          EDD:   10/21/16
Impression

SIUP at 38+3 weeks
Cephalic presentation
Normal amniotic fluid volume
BPP [DATE]
Recommendations

Follow-up as clinically indicated

## 2021-07-29 ENCOUNTER — Emergency Department: Admit: 2021-07-30 | Payer: BLUE CROSS/BLUE SHIELD

## 2021-07-29 NOTE — ED Provider Notes (Signed)
ED Provider Notes by Hezzie Bump, MD at 07/29/21 2232                Author: Hezzie Bump, MD  Service: Emergency Medicine  Author Type: Physician       Filed: 07/29/21 2348  Date of Service: 07/29/21 2232  Status: Signed          Editor: Hezzie Bump, MD (Physician)               EMERGENCY DEPARTMENT HISTORY AND PHYSICAL EXAM                Date: 07/29/2021   Patient Name: Kimberly Perez   Patient Age and Sex: 50 y.o. female   MRN:  [id number]   CSN:  [id number]        History of Presenting Illness          Chief Complaint       Patient presents with        ?  Abdominal Pain             Left lateral side pain without known injury for a week. No vomiting no diarrhea . The pain will heat up. No wounds. Denies dysuria            History Provided By: Patient      Ability to gather history was limited by:       HPI: Kimberly Perez, 50 y.o. female with no significant past medical history complains of approximately 1 week of pain along her left lateral  side.  Described as a burning sensation which has been intermittent, moderate severity.  No other associated symptoms.  No fevers or nausea or vomiting.  Normal bowel movements.  No dysuria or other GU symptoms.  She has not had similar pain before.   She denies any anterior abdominal pain.      Location:     Quality:       Severity:     Duration:    Timing:       Context:     Modifying factors:    Associated symptoms:         Past History        ??  The patient's medical, surgical, and social history on file were reviewed by me today.      ??  The family history was reviewed by me today and was non-contributory, unless otherwise specified below:      Past Medical History:   No past medical history on file.      Past Surgical History:   No past surgical history on file.      Family History:   No family history on file.      Social History:          Current Medications:     No current facility-administered medications on file prior to  encounter.          No current outpatient medications on file prior to encounter.           Allergies:   No Known Allergies     Review of Systems     ??  A complete ROS was reviewed by me today and was negative, unless otherwise specified below:      Review of Systems    Constitutional:  Negative for fatigue and fever.    Respiratory:  Negative for shortness of breath.     Cardiovascular:  Negative for chest pain.  Gastrointestinal:  Positive for abdominal pain.    Genitourinary:  Negative for dysuria.    All other systems reviewed and are negative.        Physical Exam     Vital Signs   Patient Vitals for the past 8 hrs:            Temp  Pulse  Resp  BP  SpO2            07/29/21 1850  98.6 ??F (37 ??C)  84  16  132/88  100 %              Physical Exam   Vitals and nursing note reviewed.    Constitutional:        General: She is not in acute distress.      Appearance: Normal appearance. She is well-developed. She is not ill-appearing.    HENT:       Head: Normocephalic and atraumatic.       Mouth/Throat:       Mouth: Mucous membranes are moist.    Eyes:       General:          Right eye: No discharge.          Left eye: No discharge.       Conjunctiva/sclera: Conjunctivae normal.     Cardiovascular:       Rate and Rhythm: Normal rate and regular rhythm.       Heart sounds: Normal heart sounds. No murmur heard.   Pulmonary:       Effort: Pulmonary effort is normal. No respiratory distress.       Breath sounds: Normal breath sounds. No wheezing.     Abdominal:       General: There is no distension.       Palpations: Abdomen is soft.       Tenderness: There is no abdominal tenderness.     Musculoskeletal:          General: No deformity. Normal range of motion.       Cervical back: Normal range of motion and neck supple.    Skin:      General: Skin is warm and dry.       Findings: No rash.    Neurological:       General: No focal deficit present.       Mental Status: She is alert and oriented to person, place, and time.     Psychiatric:          Speech: Speech normal.          Behavior: Behavior normal.          Cognition and Memory: Cognition normal.            Diagnostic Study Results     Labs     Recent Results (from the past 24 hour(s))     CBC WITH AUTOMATED DIFF          Collection Time: 07/29/21  7:10 PM         Result  Value  Ref Range            WBC  8.3  3.6 - 11.0 K/uL       RBC  4.86  3.80 - 5.20 M/uL       HGB  15.1  11.5 - 16.0 g/dL       HCT  43.3  35.0 - 47.0 %       MCV  89.1  80.0 - 99.0 FL       MCH  31.1  26.0 - 34.0 PG       MCHC  34.9  30.0 - 36.5 g/dL       RDW  14.5  11.5 - 14.5 %       PLATELET  325  150 - 400 K/uL       MPV  10.2  8.9 - 12.9 FL       NRBC  0.0  0 PER 100 WBC       ABSOLUTE NRBC  0.00  0.00 - 0.01 K/uL       NEUTROPHILS  68  32 - 75 %       LYMPHOCYTES  25  12 - 49 %       MONOCYTES  5  5 - 13 %       EOSINOPHILS  1  0 - 7 %       BASOPHILS  1  0 - 1 %       IMMATURE GRANULOCYTES  0  0.0 - 0.5 %       ABS. NEUTROPHILS  5.6  1.8 - 8.0 K/UL       ABS. LYMPHOCYTES  2.1  0.8 - 3.5 K/UL       ABS. MONOCYTES  0.4  0.0 - 1.0 K/UL       ABS. EOSINOPHILS  0.1  0.0 - 0.4 K/UL       ABS. BASOPHILS  0.1  0.0 - 0.1 K/UL       ABS. IMM. GRANS.  0.0  0.00 - 0.04 K/UL       DF  AUTOMATED          METABOLIC PANEL, COMPREHENSIVE          Collection Time: 07/29/21  7:10 PM         Result  Value  Ref Range            Sodium  135 (L)  136 - 145 mmol/L       Potassium  3.6  3.5 - 5.1 mmol/L       Chloride  106  97 - 108 mmol/L       CO2  26  21 - 32 mmol/L       Anion gap  3 (L)  5 - 15 mmol/L       Glucose  88  65 - 100 mg/dL       BUN  10  6 - 20 MG/DL       Creatinine  1.01  0.55 - 1.02 MG/DL       BUN/Creatinine ratio  10 (L)  12 - 20         eGFR  >60  >60 ml/min/1.82m       Calcium  9.4  8.5 - 10.1 MG/DL       Bilirubin, total  0.7  0.2 - 1.0 MG/DL       ALT (SGPT)  26  12 - 78 U/L       AST (SGOT)  22  15 - 37 U/L       Alk. phosphatase  59  45 - 117 U/L       Protein, total  7.8  6.4 - 8.2 g/dL        Albumin  3.8  3.5 - 5.0 g/dL       Globulin  4.0  2.0 - 4.0 g/dL  A-G Ratio  1.0 (L)  1.1 - 2.2         URINALYSIS W/ REFLEX CULTURE          Collection Time: 07/29/21  7:10 PM       Specimen: Urine         Result  Value  Ref Range            Color  YELLOW/STRAW          Appearance  CLEAR  CLEAR         Specific gravity  <1.005         pH (UA)  6.5  5.0 - 8.0         Protein  Negative  NEG mg/dL       Glucose  Negative  NEG mg/dL       Ketone  Negative  NEG mg/dL       Bilirubin  Negative  NEG         Blood  Negative  NEG         Urobilinogen  0.2  0.2 - 1.0 EU/dL       Nitrites  Negative  NEG         Leukocyte Esterase  TRACE (A)  NEG         WBC  0-4  0 - 4 /hpf       RBC  0-5  0 - 5 /hpf       Epithelial cells  FEW  FEW /lpf       Bacteria  Negative  NEG /hpf            UA:UC IF INDICATED  CULTURE NOT INDICATED BY UA RESULT  CNI             Radiologic Studies     CT ABD PELV W CONT       Final Result     1.  No evidence of diverticulitis.     2.  Normal spleen.     3.  No acute intra-abdominal pathology.     4.  No fracture or body wall hematoma.                 CT Results  (Last 48 hours)                                       07/29/21 2230    CT ABD PELV W CONT  Final result            Impression:    1.  No evidence of diverticulitis.      2.  Normal spleen.      3.  No acute intra-abdominal pathology.      4.  No fracture or body wall hematoma.                       Narrative:    EXAM: CT ABD PELV W CONT             INDICATION: left lateral side pain x1 week, eval diverticulitis, spleen             COMPARISON: None              CONTRAST: 100 mL of Isovue-370.             ORAL CONTRAST: None  TECHNIQUE:       Following the uneventful intravenous administration of contrast, thin axial      images were obtained through the abdomen and pelvis. Coronal and sagittal      reconstructions were generated. CT dose reduction was achieved through use of a      standardized protocol tailored for this  examination and automatic exposure      control for dose modulation.             FINDINGS:       LOWER THORAX: No significant abnormality in the incidentally imaged lower chest.      LIVER: No mass.      BILIARY TREE: Gallbladder is within normal limits. CBD is not dilated.      SPLEEN: within normal limits.      PANCREAS: No mass or ductal dilatation.      ADRENALS: Unremarkable.      KIDNEYS: No mass, calculus, or hydronephrosis.      STOMACH: Unremarkable.      SMALL BOWEL: No dilatation or wall thickening.      COLON: No dilatation or wall thickening.      APPENDIX: Normal      PERITONEUM: No ascites or pneumoperitoneum.      RETROPERITONEUM: No lymphadenopathy or aortic aneurysm.      REPRODUCTIVE ORGANS: Within normal limits.      URINARY BLADDER: No mass or calculus.      BONES: No destructive bone lesion.      ABDOMINAL WALL: No mass or hernia.      ADDITIONAL COMMENTS: N/A                                      CXR Results  (Last 48 hours)             None                       Billable Procedures     EKG reviewed by ED Physician in the absence of a cardiologist:  Yes   EKG below was interpreted by Marijean Bravo, MD      Procedures        Medical Decision Making        I reviewed the patient's most recent Emergency Dept notes and diagnostic tests in formulating my MDM on today's visit.      Provider Notes (Medical Decision Making):    50 year old female presenting with pain in the left lateral side, atraumatic, for the past week, without any other associated symptoms.      Very well-appearing, no distress, normal vital signs, afebrile.  Examination of the abdomen is completely benign, no concerning tenderness.  She also does not have significant tenderness in the side or flank based on my exam.      Given that this is new onset pain which has been present for a week, CT imaging was obtained.  CT scan was normal, consistent with my examination.  No diverticulitis or splenic injuries or abnormalities.  No  kidney stones.      Likely musculoskeletal pain.  Rx ibuprofen, primary care follow-up.         Marijean Bravo, MD   10:32 PM   07/29/2021                 Medications Administered during ED course:     Medications  ketorolac (TORADOL) injection 30 mg (30 mg IntraVENous Given 07/29/21 2055)       iopamidoL (ISOVUE-370) 76 % injection 100 mL (100 mL IntraVENous Given 07/29/21 2233)               Prescriptions from today's ED visit:     Discharge Medication List as of 07/29/2021 11:16 PM                  Diagnosis and Disposition        Disposition:  Discharged      Clinical Impression:       1.  Muscular abdominal pain in left flank         2.  Acute left flank pain            Attestation:   I personally performed the services described in this documentation on this date 07/29/2021 for patient Nate Perez.     Marijean Bravo, MD            I was the first provider for this patient on this visit.  To the best of my ability I reviewed relevant prior medical records, electrocardiograms, laboratories, and radiologic studies.     The patient's presenting problems were discussed, and the patient was in agreement with the care plan formulated and outlined with them.        Marijean Bravo, MD      Please note that this dictation was completed with Dragon voice recognition software. Quite often unanticipated grammatical, syntax, homophones, and other interpretive errors are inadvertently transcribed  by the computer software.    Please disregard these errors and excuse any errors that have escaped final proofreading.

## 2021-07-30 LAB — URINALYSIS W/ REFLEX CULTURE
BACTERIA, URINE: NEGATIVE /hpf
Bacteria: NEGATIVE /hpf
Bilirubin, Urine: NEGATIVE
Bilirubin: NEGATIVE
Blood, Urine: NEGATIVE
Blood: NEGATIVE
Glucose, Ur: NEGATIVE mg/dL
Glucose: NEGATIVE mg/dL
Ketone: NEGATIVE mg/dL
Ketones, Urine: NEGATIVE mg/dL
Nitrite, Urine: NEGATIVE
Nitrites: NEGATIVE
Protein, UA: NEGATIVE mg/dL
Protein: NEGATIVE mg/dL
Specific Gravity, UA: <1.005 NA
Specific gravity: <1.005
Urobilinogen, UA, POCT: 0.2 EU/dL (ref 0.2–1.0)
Urobilinogen: 0.2 EU/dL (ref 0.2–1.0)
pH (UA): 6.5 (ref 5.0–8.0)
pH, UA: 6.5 (ref 5.0–8.0)

## 2021-07-30 LAB — CBC WITH AUTO DIFFERENTIAL
Basophils %: 1 % (ref 0–1)
Basophils Absolute: 0.1 10*3/uL (ref 0.0–0.1)
Eosinophils %: 1 % (ref 0–7)
Eosinophils Absolute: 0.1 10*3/uL (ref 0.0–0.4)
Granulocyte Absolute Count: 0 10*3/uL (ref 0.00–0.04)
Hematocrit: 43.3 % (ref 35.0–47.0)
Hemoglobin: 15.1 g/dL (ref 11.5–16.0)
Immature Granulocytes: 0 % (ref 0.0–0.5)
Lymphocytes %: 25 % (ref 12–49)
Lymphocytes Absolute: 2.1 10*3/uL (ref 0.8–3.5)
MCH: 31.1 PG (ref 26.0–34.0)
MCHC: 34.9 g/dL (ref 30.0–36.5)
MCV: 89.1 FL (ref 80.0–99.0)
MPV: 10.2 FL (ref 8.9–12.9)
Monocytes %: 5 % (ref 5–13)
Monocytes Absolute: 0.4 10*3/uL (ref 0.0–1.0)
NRBC Absolute: 0 10*3/uL (ref 0.00–0.01)
Neutrophils %: 68 % (ref 32–75)
Neutrophils Absolute: 5.6 10*3/uL (ref 1.8–8.0)
Nucleated RBCs: 0 PER 100 WBC
Platelets: 325 10*3/uL (ref 150–400)
RBC: 4.86 M/uL (ref 3.80–5.20)
RDW: 14.5 % (ref 11.5–14.5)
WBC: 8.3 10*3/uL (ref 3.6–11.0)

## 2021-07-30 LAB — COMPREHENSIVE METABOLIC PANEL
ALT: 26 U/L (ref 12–78)
AST: 22 U/L (ref 15–37)
Albumin/Globulin Ratio: 1 — ABNORMAL LOW (ref 1.1–2.2)
Albumin: 3.8 g/dL (ref 3.5–5.0)
Alkaline Phosphatase: 59 U/L (ref 45–117)
Anion Gap: 3 mmol/L — ABNORMAL LOW (ref 5–15)
BUN: 10 MG/DL (ref 6–20)
Bun/Cre Ratio: 10 — ABNORMAL LOW (ref 12–20)
CO2: 26 mmol/L (ref 21–32)
Calcium: 9.4 MG/DL (ref 8.5–10.1)
Chloride: 106 mmol/L (ref 97–108)
Creatinine: 1.01 MG/DL (ref 0.55–1.02)
ESTIMATED GLOMERULAR FILTRATION RATE: >60 mL/min/{1.73_m2} (ref >60)
Globulin: 4 g/dL (ref 2.0–4.0)
Glucose: 88 mg/dL (ref 65–100)
Potassium: 3.6 mmol/L (ref 3.5–5.1)
Sodium: 135 mmol/L — ABNORMAL LOW (ref 136–145)
Total Bilirubin: 0.7 MG/DL (ref 0.2–1.0)
Total Protein: 7.8 g/dL (ref 6.4–8.2)

## 2021-07-30 LAB — METABOLIC PANEL, COMPREHENSIVE
A-G Ratio: 1 — ABNORMAL LOW (ref 1.1–2.2)
ALT (SGPT): 26 U/L (ref 12–78)
AST (SGOT): 22 U/L (ref 15–37)
Albumin: 3.8 g/dL (ref 3.5–5.0)
Alk. phosphatase: 59 U/L (ref 45–117)
Anion gap: 3 mmol/L — ABNORMAL LOW (ref 5–15)
BUN/Creatinine ratio: 10 — ABNORMAL LOW (ref 12–20)
BUN: 10 MG/DL (ref 6–20)
Bilirubin, total: 0.7 MG/DL (ref 0.2–1.0)
CO2: 26 mmol/L (ref 21–32)
Calcium: 9.4 MG/DL (ref 8.5–10.1)
Chloride: 106 mmol/L (ref 97–108)
Creatinine: 1.01 MG/DL (ref 0.55–1.02)
Globulin: 4 g/dL (ref 2.0–4.0)
Glucose: 88 mg/dL (ref 65–100)
Potassium: 3.6 mmol/L (ref 3.5–5.1)
Protein, total: 7.8 g/dL (ref 6.4–8.2)
Sodium: 135 mmol/L — ABNORMAL LOW (ref 136–145)
eGFR: >60 mL/min/{1.73_m2} (ref >60)

## 2021-07-30 LAB — CBC WITH AUTOMATED DIFF
ABS. BASOPHILS: 0.1 10*3/uL (ref 0.0–0.1)
ABS. EOSINOPHILS: 0.1 10*3/uL (ref 0.0–0.4)
ABS. IMM. GRANS.: 0 10*3/uL (ref 0.00–0.04)
ABS. LYMPHOCYTES: 2.1 10*3/uL (ref 0.8–3.5)
ABS. MONOCYTES: 0.4 10*3/uL (ref 0.0–1.0)
ABS. NEUTROPHILS: 5.6 10*3/uL (ref 1.8–8.0)
ABSOLUTE NRBC: 0 10*3/uL (ref 0.00–0.01)
BASOPHILS: 1 % (ref 0–1)
EOSINOPHILS: 1 % (ref 0–7)
HCT: 43.3 % (ref 35.0–47.0)
HGB: 15.1 g/dL (ref 11.5–16.0)
IMMATURE GRANULOCYTES: 0 % (ref 0.0–0.5)
LYMPHOCYTES: 25 % (ref 12–49)
MCH: 31.1 PG (ref 26.0–34.0)
MCHC: 34.9 g/dL (ref 30.0–36.5)
MCV: 89.1 FL (ref 80.0–99.0)
MONOCYTES: 5 % (ref 5–13)
MPV: 10.2 FL (ref 8.9–12.9)
NEUTROPHILS: 68 % (ref 32–75)
NRBC: 0 PER 100 WBC
PLATELET: 325 10*3/uL (ref 150–400)
RBC: 4.86 M/uL (ref 3.80–5.20)
RDW: 14.5 % (ref 11.5–14.5)
WBC: 8.3 10*3/uL (ref 3.6–11.0)

## 2021-07-30 MED ORDER — IBUPROFEN 600 MG TAB
600 mg | ORAL_TABLET | Freq: Four times a day (QID) | ORAL | 0 refills | Status: AC | PRN
Start: 2021-07-30 — End: ?

## 2021-07-30 MED ORDER — IOPAMIDOL 76 % IV SOLN
370 mg iodine /mL (76 %) | Freq: Once | INTRAVENOUS | Status: AC
Start: 2021-07-30 — End: 2021-07-29
  Administered 2021-07-30: 04:00:00 via INTRAVENOUS

## 2021-07-30 MED ORDER — KETOROLAC TROMETHAMINE 30 MG/ML INJECTION
30 mg/mL (1 mL) | INTRAMUSCULAR | Status: AC
Start: 2021-07-30 — End: 2021-07-29
  Administered 2021-07-30: 02:00:00 via INTRAVENOUS

## 2021-07-30 MED FILL — KETOROLAC TROMETHAMINE 30 MG/ML INJECTION: 30 mg/mL (1 mL) | INTRAMUSCULAR | Qty: 1

## 2021-07-30 MED FILL — ISOVUE-370  76 % INTRAVENOUS SOLUTION: 370 mg iodine /mL (76 %) | INTRAVENOUS | Qty: 100

## 2022-02-25 ENCOUNTER — Emergency Department: Admit: 2022-02-25 | Payer: PRIVATE HEALTH INSURANCE

## 2022-02-25 ENCOUNTER — Inpatient Hospital Stay
Admit: 2022-02-25 | Discharge: 2022-02-25 | Disposition: A | Discharge status: Home / Self Care | Payer: PRIVATE HEALTH INSURANCE | Attending: Student in an Organized Health Care Education/Training Program

## 2022-02-25 DIAGNOSIS — R0602 Shortness of breath: Secondary | ICD-10-CM

## 2022-02-25 LAB — CBC WITH AUTO DIFFERENTIAL
Absolute Immature Granulocyte: 0 10*3/uL (ref 0.00–0.04)
Basophils %: 1 % (ref 0–1)
Basophils Absolute: 0.1 10*3/uL (ref 0.0–0.1)
Eosinophils %: 1 % (ref 0–7)
Eosinophils Absolute: 0.1 10*3/uL (ref 0.0–0.4)
Hematocrit: 45.1 % (ref 35.0–47.0)
Hemoglobin: 15.8 g/dL (ref 11.5–16.0)
Immature Granulocytes: 0 % (ref 0.0–0.5)
Lymphocytes %: 24 % (ref 12–49)
Lymphocytes Absolute: 1.6 10*3/uL (ref 0.8–3.5)
MCH: 30.5 PG (ref 26.0–34.0)
MCHC: 35 g/dL (ref 30.0–36.5)
MCV: 87.1 FL (ref 80.0–99.0)
MPV: 10.9 FL (ref 8.9–12.9)
Monocytes %: 7 % (ref 5–13)
Monocytes Absolute: 0.5 10*3/uL (ref 0.0–1.0)
Neutrophils %: 67 % (ref 32–75)
Neutrophils Absolute: 4.3 10*3/uL (ref 1.8–8.0)
Nucleated RBCs: 0 PER 100 WBC
Platelets: 256 10*3/uL (ref 150–400)
RBC: 5.18 M/uL (ref 3.80–5.20)
RDW: 13.1 % (ref 11.5–14.5)
WBC: 6.5 10*3/uL (ref 3.6–11.0)
nRBC: 0 10*3/uL (ref 0.00–0.01)

## 2022-02-25 LAB — COMPREHENSIVE METABOLIC PANEL
ALT: 36 U/L (ref 12–78)
AST: 25 U/L (ref 15–37)
Albumin/Globulin Ratio: 1.1 (ref 1.1–2.2)
Albumin: 3.9 g/dL (ref 3.5–5.0)
Alk Phosphatase: 76 U/L (ref 45–117)
Anion Gap: 5 mmol/L (ref 5–15)
BUN: 13 MG/DL (ref 6–20)
Bun/Cre Ratio: 13 (ref 12–20)
CO2: 29 mmol/L (ref 21–32)
Calcium: 10.4 MG/DL — ABNORMAL HIGH (ref 8.5–10.1)
Chloride: 104 mmol/L (ref 97–108)
Creatinine: 0.98 MG/DL (ref 0.55–1.02)
Est, Glom Filt Rate: >60 mL/min/{1.73_m2} (ref >60)
Globulin: 3.4 g/dL (ref 2.0–4.0)
Glucose: 87 mg/dL (ref 65–100)
Potassium: 4.1 mmol/L (ref 3.5–5.1)
Sodium: 138 mmol/L (ref 136–145)
Total Bilirubin: 1.1 MG/DL — ABNORMAL HIGH (ref 0.2–1.0)
Total Protein: 7.3 g/dL (ref 6.4–8.2)

## 2022-02-25 LAB — TROPONIN: Troponin, High Sensitivity: <4 ng/L (ref 0–51)

## 2022-02-25 LAB — EXTRA TUBES HOLD

## 2022-02-25 MED ORDER — IOPAMIDOL 76 % IV SOLN
76 % | Freq: Once | INTRAVENOUS | Status: AC | PRN
Start: 2022-02-25 — End: 2022-02-25
  Administered 2022-02-25: 18:00:00 75 mL via INTRAVENOUS

## 2022-02-25 MED FILL — ISOVUE-370 76 % IV SOLN: 76 % | INTRAVENOUS | Qty: 100

## 2022-02-25 NOTE — ED Provider Notes (Addendum)
[facility]  EMERGENCY DEPARTMENT Gareth Morgan    MRN: [id number]  Birthdate Oct 03, 1970  Date of evaluation: 02/25/2022  Provider: Berle Mull, MD   PCP: None None  Note Started: 3:11 PM 02/25/22     CHIEF COMPLAINT       No chief complaint on file.       HISTORY OF PRESENT ILLNESS: 1 or more elements   History From: PT, History limited by: none     Kimberly Perez is a 51 y.o. female with Pmhx listed below     Patient is a 51 year old female with history of DVT on anticoagulation, hypertension presenting with concern of dizziness, lightheadedness, episode of shortness of breath while at church today, patient states that she sat down, feels better currently, denies any chest pain at the time of the event, states she has also had worsening left lower extremity pain, was diagnosed with DVT 2 months ago, has been taking Xarelto as directed but states that she is concerned about the return of symptoms today.  Patient denies any history of PE at the time of diagnosis, states that she has otherwise been in her usual state of health, denies any fevers chills cough URI symptoms, nausea vomiting diarrhea, no other complaints this time.      Nursing Notes were all reviewed and agreed with or any disagreements were addressed in the HPI.     REVIEW OF SYSTEMS      Positives and Pertinent negatives as per HPI.    PAST HISTORY     Past Medical History:  No past medical history on file.  Previous Medications    IBUPROFEN (ADVIL;MOTRIN) 600 MG TABLET    Take 1 tablet by mouth every 6 hours as needed       Past Surgical History:  No past surgical history on file.    Family History:  No family history on file.    Social History:       Allergies:  No Known Allergies    PHYSICAL EXAM      '@EDTRIAGEVITALSNEW' @     Physical Exam  Vitals reviewed.   Constitutional:       General: She is not in acute distress.     Appearance: Normal appearance. She is not ill-appearing or toxic-appearing.   HENT:      Head: Normocephalic.      Nose: Nose  normal.      Mouth/Throat:      Mouth: Mucous membranes are moist.   Eyes:      Conjunctiva/sclera: Conjunctivae normal.   Cardiovascular:      Rate and Rhythm: Normal rate.   Pulmonary:      Effort: Pulmonary effort is normal. No respiratory distress.      Breath sounds: No wheezing, rhonchi or rales.   Abdominal:      General: Abdomen is flat.      Tenderness: There is no abdominal tenderness. There is no guarding or rebound.   Musculoskeletal:         General: No deformity.      Cervical back: Neck supple.      Right lower leg: No edema.      Left lower leg: No edema.      Comments: Mild tenderness palpation over left lower extremity, mostly located anterior to shin and left calf, no swelling   Skin:     General: Skin is warm and dry.   Neurological:      General: No focal deficit present.  Mental Status: She is alert.   Psychiatric:         Mood and Affect: Mood normal.        EMERGENCY DEPARTMENT COURSE and DIFFERENTIAL DIAGNOSIS/MDM   CC/HPI Summary, DDx, ED Course, and Reassessment:     MDM  Number of Diagnoses or Management Options  Left leg pain  Shortness of breath  Diagnosis management comments: Patient is a 51 year old female with history of DVT in the left lower extremity, on Xarelto presenting with concern of episode of lightheadedness, shortness of breath while in church today, patient has had resolution of symptoms but states that she has had worsening left lower extremity pain, this is where her DVT was back 2 months ago, has been taking her blood thinners directed, patient present stable, no apparent distress, no significant swelling of the lower extremity, does have some pain there, due to history of blood clot with symptoms of lightheadedness and shortness of breath today concern of possible consideration of PE, will plan on PE study today, will repeat DVT study, continue to observe, obtain CBC CMP and reevaluate for final disposition.             Vitals:    02/25/22 1315 02/25/22 1330  02/25/22 1345 02/25/22 1415   BP: 118/74 102/69 107/77    Pulse: 73 73 78 83   Resp: '18 18 16 15   ' Temp:       SpO2:    98%        Patient was given the following medications:  Previous Medications    IBUPROFEN (ADVIL;MOTRIN) 600 MG TABLET    Take 1 tablet by mouth every 6 hours as needed       CONSULTS:  None    Social Determinants affecting Dx or Tx:   Records Reviewed (source and summary of external notes):   DIAGNOSTIC RESULTS   LABS:  Recent Results (from the past 24 hour(s))   EKG 12 Lead    Collection Time: 02/25/22 11:08 AM   Result Value Ref Range    Ventricular Rate 71 BPM    Atrial Rate 71 BPM    P-R Interval 166 ms    QRS Duration 70 ms    Q-T Interval 386 ms    QTc Calculation (Bazett) 419 ms    P Axis 55 degrees    R Axis 4 degrees    T Axis 23 degrees    Diagnosis       Normal sinus rhythm  Normal ECG  No previous ECGs available     CBC with Auto Differential    Collection Time: 02/25/22 11:53 AM   Result Value Ref Range    WBC 6.5 3.6 - 11.0 K/uL    RBC 5.18 3.80 - 5.20 M/uL    Hemoglobin 15.8 11.5 - 16.0 g/dL    Hematocrit 45.1 35.0 - 47.0 %    MCV 87.1 80.0 - 99.0 FL    MCH 30.5 26.0 - 34.0 PG    MCHC 35.0 30.0 - 36.5 g/dL    RDW 13.1 11.5 - 14.5 %    Platelets 256 150 - 400 K/uL    MPV 10.9 8.9 - 12.9 FL    Nucleated RBCs 0.0 0 PER 100 WBC    nRBC 0.00 0.00 - 0.01 K/uL    Neutrophils % 67 32 - 75 %    Lymphocytes % 24 12 - 49 %    Monocytes % 7 5 - 13 %    Eosinophils %  1 0 - 7 %    Basophils % 1 0 - 1 %    Immature Granulocytes 0 0.0 - 0.5 %    Neutrophils Absolute 4.3 1.8 - 8.0 K/UL    Lymphocytes Absolute 1.6 0.8 - 3.5 K/UL    Monocytes Absolute 0.5 0.0 - 1.0 K/UL    Eosinophils Absolute 0.1 0.0 - 0.4 K/UL    Basophils Absolute 0.1 0.0 - 0.1 K/UL    Absolute Immature Granulocyte 0.0 0.00 - 0.04 K/UL    Differential Type AUTOMATED     Comprehensive Metabolic Panel    Collection Time: 02/25/22 11:53 AM   Result Value Ref Range    Sodium 138 136 - 145 mmol/L    Potassium 4.1 3.5 - 5.1 mmol/L     Chloride 104 97 - 108 mmol/L    CO2 29 21 - 32 mmol/L    Anion Gap 5 5 - 15 mmol/L    Glucose 87 65 - 100 mg/dL    BUN 13 6 - 20 MG/DL    Creatinine 0.98 0.55 - 1.02 MG/DL    Bun/Cre Ratio 13 12 - 20      Est, Glom Filt Rate >60 >60 ml/min/1.25m    Calcium 10.4 (H) 8.5 - 10.1 MG/DL    Total Bilirubin 1.1 (H) 0.2 - 1.0 MG/DL    ALT 36 12 - 78 U/L    AST 25 15 - 37 U/L    Alk Phosphatase 76 45 - 117 U/L    Total Protein 7.3 6.4 - 8.2 g/dL    Albumin 3.9 3.5 - 5.0 g/dL    Globulin 3.4 2.0 - 4.0 g/dL    Albumin/Globulin Ratio 1.1 1.1 - 2.2     Troponin    Collection Time: 02/25/22 11:53 AM   Result Value Ref Range    Troponin, High Sensitivity <4 0 - 51 ng/L   Extra Tubes Hold    Collection Time: 02/25/22 11:53 AM   Result Value Ref Range    Specimen HOld URINE     Comment:        Add-on orders for these samples will be processed based on acceptable specimen integrity and analyte stability, which may vary by analyte.       EKG interpreted by me: Normal sinus rhythm, no ST changes     RADIOLOGY:  Non-plain film images such as CT, Ultrasound and MRI are read by the radiologist.   Plain radiographic images are often visualized and preliminarily interpreted by the ED, if an interpretation was completed the findings will be listed below:       Interpretation per the Radiologist below, if available at the time of this note:    Vascular duplex lower extremity venous left   Final Result      CTA CHEST W WO CONTRAST PE Eval   Final Result   There is no pulmonary embolism.   There is no aortic aneurysm or dissection.   No acute intrathoracic process is identified. Incidental findings are as   described above.                    PROCEDURES   Unless otherwise noted below, none  Procedures     CRITICAL CARE TIME       FINAL IMPRESSION     1. Shortness of breath    2. Left leg pain          DISPOSITION/PLAN   DISPOSITION Decision To Discharge 02/25/2022 03:07:04  PM         PATIENT REFERRED TO:   None None          [facility]  [facility]  [address]  [place]  [id number]           DISCHARGE MEDICATIONS:     Medication List        ASK your doctor about these medications      ibuprofen 600 MG tablet  Commonly known as: ADVIL;MOTRIN                DISCONTINUED MEDICATIONS:  Current Discharge Medication List          I am the Primary Clinician of Record.   Signed By: Berle Mull, MD     February 25, 2022      (Please note that parts of this dictation were completed with voice recognition software. Quite often unanticipated grammatical, syntax, homophones, and other interpretive errors are inadvertently transcribed by the computer software. Please disregards these errors. Please excuse any errors that have escaped final proofreading.)           Berle Mull, MD  Resident  02/25/22 [place], MD  Resident  02/25/22 [phone]

## 2022-02-25 NOTE — Discharge Instructions (Signed)
You are seen evaluated in ER today for a episode of shortness breath and lightheadedness as well as left leg pain, you had DVT studies which were negative as well as a CT scan of the chest which showed no blood clot.  Your lab work looked normal, please follow-up with your primary care doctor for ongoing symptoms, you consider taking over-the-counter medication for pain as needed, return to ER for any new or concerning symptoms.

## 2022-02-25 NOTE — ED Notes (Signed)
Discharge papers reviewed with patient, expressed understanding. Close f/u recommended with PCP. Return to ED for any worsening/ concerning symptoms.      Allyson Sabal, RN  02/25/22 [phone]

## 2022-02-27 LAB — EKG 12-LEAD
Atrial Rate: 71 {beats}/min
Diagnosis: NORMAL
P Axis: 55 degrees
P-R Interval: 166 ms
Q-T Interval: 386 ms
QRS Duration: 70 ms
QTc Calculation (Bazett): 419 ms
R Axis: 4 degrees
T Axis: 23 degrees
Ventricular Rate: 71 {beats}/min

## 2023-01-20 ENCOUNTER — Inpatient Hospital Stay
Admit: 2023-01-20 | Discharge: 2023-01-20 | Disposition: A | Discharge status: Home / Self Care | Payer: PRIVATE HEALTH INSURANCE

## 2023-01-20 DIAGNOSIS — M5432 Sciatica, left side: Secondary | ICD-10-CM

## 2023-01-20 LAB — URINALYSIS WITH REFLEX TO CULTURE
BACTERIA, URINE: NEGATIVE /hpf
Bilirubin, Urine: NEGATIVE
Blood, Urine: NEGATIVE
Glucose, Ur: NEGATIVE mg/dL
Ketones, Urine: NEGATIVE mg/dL
Leukocyte Esterase, Urine: NEGATIVE
Nitrite, Urine: NEGATIVE
Protein, UA: NEGATIVE mg/dL
Specific Gravity, UA: 1.01 (ref 1.003–1.030)
Urobilinogen, Urine: 0.2 EU/dL (ref 0.2–1.0)
pH, Urine: 6 (ref 5.0–8.0)

## 2023-01-20 LAB — POC PREGNANCY UR-QUAL: Preg Test, Ur: NEGATIVE

## 2023-01-20 MED ORDER — METHYLPREDNISOLONE 4 MG PO TBPK
4 | ORAL_TABLET | ORAL | 0 refills | Status: AC
Start: 2023-01-20 — End: 2023-01-26

## 2023-01-20 NOTE — ED Notes (Signed)
Patient given discharge papers and instructions by this RN. Patient verbalized understanding and stated not having questions or concerns regarding her care. Patient ambulatory out of ED in no new acute distress.

## 2023-01-20 NOTE — ED Provider Notes (Signed)
[facility]  EMERGENCY DEPARTMENT ENCOUNTER      Pt Name: Kimberly Perez  MRN: [id number]  Birthdate March 21, 1971  Date of evaluation: 01/20/2023  Provider: Joselyn Arrow, MD      HISTORY OF PRESENT ILLNESS      52 year old African-American female presenting to the ER for left flank and left thigh pain.  Patient reports symptoms have been going on about a week.  She had a DVT ultrasound on Wednesday that she states was negative for blood clot.  Reports that when she was having bowel movement today she felt more pain in her left flank and left quadriceps area.  She is able to ambulate fine.  Denies any overexertion injury or trauma to her back or leg.  Denies any urinary symptoms              Nursing Notes were reviewed.    REVIEW OF SYSTEMS         Review of Systems   Constitutional:  Negative for chills and fever.   HENT:  Negative for congestion and sore throat.    Eyes:  Negative for pain and visual disturbance.   Respiratory:  Negative for chest tightness and shortness of breath.    Cardiovascular:  Negative for chest pain and leg swelling.   Gastrointestinal:  Negative for abdominal pain and vomiting.   Genitourinary:  Positive for flank pain. Negative for dysuria.   Musculoskeletal:  Positive for back pain and myalgias.   Skin:  Negative for rash.   Neurological:  Negative for weakness and headaches.   All other systems reviewed and are negative.          PAST MEDICAL HISTORY   No past medical history on file.      SURGICAL HISTORY     No past surgical history on file.      CURRENT MEDICATIONS       Previous Medications    IBUPROFEN (ADVIL;MOTRIN) 600 MG TABLET    Take 1 tablet by mouth every 6 hours as needed       ALLERGIES     Aleve [naproxen] and Nsaids    FAMILY HISTORY     No family history on file.       SOCIAL HISTORY       Social History     Socioeconomic History    Marital status: Married         PHYSICAL EXAM       ED Triage Vitals [01/20/23 1330]   BP Temp Temp Source Pulse Respirations  SpO2 Height Weight - Scale   127/85 97.4 F (36.3 C) Tympanic 68 16 99 % 1.6 m (5\' 3" ) 75.7 kg (166 lb 14.2 oz)       Body mass index is 29.56 kg/m.    Physical Exam  Vitals and nursing note reviewed.   Constitutional:       Appearance: Normal appearance.   HENT:      Head: Normocephalic and atraumatic.      Nose: Nose normal.      Mouth/Throat:      Mouth: Mucous membranes are moist.      Pharynx: No oropharyngeal exudate.   Eyes:      Extraocular Movements: Extraocular movements intact.      Pupils: Pupils are equal, round, and reactive to light.   Cardiovascular:      Rate and Rhythm: Normal rate and regular rhythm.   Pulmonary:      Effort: Pulmonary effort is  normal. No respiratory distress.      Breath sounds: Normal breath sounds.   Abdominal:      General: Abdomen is flat. There is no distension.      Palpations: Abdomen is soft.   Musculoskeletal:         General: No swelling. Normal range of motion.      Cervical back: Normal range of motion and neck supple.      Comments: There is some tenderness to left quadriceps area   Skin:     General: Skin is warm and dry.   Neurological:      General: No focal deficit present.      Mental Status: She is alert and oriented to person, place, and time. Mental status is at baseline.             EMERGENCY DEPARTMENT COURSE and DIFFERENTIAL DIAGNOSIS/MDM:   Vitals:    Vitals:    01/20/23 1330   BP: 127/85   Pulse: 68   Resp: 16   Temp: 97.4 F (36.3 C)   TempSrc: Tympanic   SpO2: 99%   Weight: 75.7 kg (166 lb 14.2 oz)   Height: 1.6 m (5\' 3" )         Medical Decision Making  I tried to get blood work 3 different sticks.  Unsuccessful.  Patient did not want any more venipuncture done.  Urinalysis negative for infection.  She may have sciatica.  Pain going down left buttocks to the thigh area.  Will prescribe Medrol Dosepak.  Advised primary care follow-up.  DVT study negative a few days ago.    Amount and/or Complexity of Data Reviewed  Labs:  ordered.    Risk  Prescription drug management.            REASSESSMENT          CONSULTS:  None    PROCEDURES:     Procedures        (Please note that portions of this note were completed with a voice recognition program.  Efforts were made to edit the dictations but occasionally words are mis-transcribed.)    Joselyn Arrow, MD (electronically signed)  Emergency Attending Physician              Joselyn Arrow, MD  01/20/23 [phone]

## 2023-01-20 NOTE — ED Triage Notes (Signed)
Patient arrives with c/o left knee pain for a week that radiates up to her left flank. Taking tylenol with not much relief. 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 Patient denies birth control, smoking or long travel. Was on xerelto for 3 months for her previous. Reports there is some discoloration to the left ankle and that's where the pain originated last year when she had the clot.

## 2023-01-20 NOTE — ED Notes (Signed)
Three unsuccessful blood draw attempts by this RN and two PCTs. Patient states she would not like to be stuck again. MD aware.

## 2023-02-13 ENCOUNTER — Encounter

## 2023-03-14 ENCOUNTER — Inpatient Hospital Stay: Admit: 2023-03-14 | Payer: PRIVATE HEALTH INSURANCE | Primary: Family Medicine

## 2023-03-14 DIAGNOSIS — Q396 Congenital diverticulum of esophagus: Secondary | ICD-10-CM

## 2023-04-07 NOTE — Patient Instructions (Signed)
I discussed my physical exam, xray findings and diagnosis with the patient in the office today. Patient was explained that they have right knee pain, buckling and locking possibly due to medial meniscus tear.    Patient was advised to take extra Strength Tylenol for this as needed.     Patient was advised to continue to apply ice throughout the day as needed.    Patient was advised to continue with activities as tolerated. Avoid aggravating activities at this time.     Patient was given a economy hinged knee brace.  I advised that she does not need to wear the brace at all times, however it may help aid with comfort support and stability of her knee at this time.  She may wear it during all daily activities. She may take it off for sleeping and showering.    Patient will follow up with Dr. Blima Dessert for MRI review.

## 2023-04-08 ENCOUNTER — Encounter

## 2023-04-10 ENCOUNTER — Inpatient Hospital Stay: Admit: 2023-04-10 | Payer: PRIVATE HEALTH INSURANCE | Primary: Family Medicine

## 2023-04-10 DIAGNOSIS — M2391 Unspecified internal derangement of right knee: Secondary | ICD-10-CM
